# Patient Record
Sex: Male | Born: 1945 | Race: White | Hispanic: No | Marital: Married | Smoking: Former smoker
Health system: Southern US, Community
[De-identification: ages and names within clinical notes are randomized; demographics above are authoritative.]

## PROBLEM LIST (undated history)

## (undated) DIAGNOSIS — T7840XA Allergy, unspecified, initial encounter: Secondary | ICD-10-CM

## (undated) DIAGNOSIS — F0390 Unspecified dementia without behavioral disturbance: Secondary | ICD-10-CM

## (undated) DIAGNOSIS — N390 Urinary tract infection, site not specified: Secondary | ICD-10-CM

## (undated) DIAGNOSIS — R413 Other amnesia: Secondary | ICD-10-CM

## (undated) HISTORY — PX: CATARACT EXTRACTION W/ INTRAOCULAR LENS  IMPLANT, BILATERAL: SHX1307

## (undated) HISTORY — PX: VARICOSE VEIN SURGERY: SHX832

## (undated) HISTORY — DX: Other amnesia: R41.3

## (undated) HISTORY — DX: Urinary tract infection, site not specified: N39.0

## (undated) HISTORY — DX: Allergy, unspecified, initial encounter: T78.40XA

---

## 2013-12-30 DIAGNOSIS — H2510 Age-related nuclear cataract, unspecified eye: Secondary | ICD-10-CM | POA: Diagnosis not present

## 2013-12-30 DIAGNOSIS — H18419 Arcus senilis, unspecified eye: Secondary | ICD-10-CM | POA: Diagnosis not present

## 2015-01-15 DIAGNOSIS — R300 Dysuria: Secondary | ICD-10-CM | POA: Diagnosis not present

## 2015-05-27 ENCOUNTER — Encounter: Payer: Self-pay | Admitting: Family Medicine

## 2015-05-27 ENCOUNTER — Ambulatory Visit (INDEPENDENT_AMBULATORY_CARE_PROVIDER_SITE_OTHER): Payer: Medicare Other | Admitting: Family Medicine

## 2015-05-27 VITALS — BP 138/82 | HR 80 | Temp 97.8°F | Ht 66.0 in | Wt 188.2 lb

## 2015-05-27 DIAGNOSIS — R413 Other amnesia: Secondary | ICD-10-CM | POA: Diagnosis not present

## 2015-05-27 DIAGNOSIS — R0683 Snoring: Secondary | ICD-10-CM

## 2015-05-27 NOTE — Patient Instructions (Addendum)
Check on previous pneumonia, flu, tetanus and shingles shots. Go to the lab on the way out.  We'll contact you with your lab report. Shirlee LimerickMarion will call about your referral. Take care.  Glad to see you.

## 2015-05-27 NOTE — Progress Notes (Signed)
Pre visit review using our clinic review tool, if applicable. No additional management support is needed unless otherwise documented below in the visit note.  New patient.  Memory concerns.  Trouble with short term memory, making conversation, following directions.  Gradually worse.  Has been acting out his dreams more.  Has been more anxious, worried.  Fatigued.  Wife has noted apnea during sleep.  Snoring more.    Hasn't seen a doc in years, ie no record to get.   D/w pt that he was due for mult health maintenance issues, we can work on that as we go along.   PMH and SH reviewed  ROS: See HPI, otherwise noncontributory.  Meds, vitals, and allergies reviewed.   GEN: nad, alert and oriented HEENT: mucous membranes moist NECK: supple w/o LA CV: rrr. PULM: ctab, no inc wob ABD: soft, +bs EXT: no edema SKIN: no acute rash Memory testing except for 1/3 on recall, was able to get 2/3 with prompting.  A&Ox3, normal watch reading and normal math testing.

## 2015-05-28 ENCOUNTER — Encounter: Payer: Self-pay | Admitting: Family Medicine

## 2015-05-28 DIAGNOSIS — F0280 Dementia in other diseases classified elsewhere without behavioral disturbance: Secondary | ICD-10-CM | POA: Insufficient documentation

## 2015-05-28 DIAGNOSIS — G309 Alzheimer's disease, unspecified: Secondary | ICD-10-CM

## 2015-05-28 DIAGNOSIS — R0683 Snoring: Secondary | ICD-10-CM | POA: Insufficient documentation

## 2015-05-28 LAB — CBC WITH DIFFERENTIAL/PLATELET
BASOS ABS: 0 10*3/uL (ref 0.0–0.1)
Basophils Relative: 0.8 % (ref 0.0–3.0)
EOS ABS: 0.3 10*3/uL (ref 0.0–0.7)
Eosinophils Relative: 4.7 % (ref 0.0–5.0)
HEMATOCRIT: 43.2 % (ref 39.0–52.0)
HEMOGLOBIN: 14.4 g/dL (ref 13.0–17.0)
LYMPHS PCT: 19.2 % (ref 12.0–46.0)
Lymphs Abs: 1.2 10*3/uL (ref 0.7–4.0)
MCHC: 33.4 g/dL (ref 30.0–36.0)
MCV: 95.6 fl (ref 78.0–100.0)
Monocytes Absolute: 0.5 10*3/uL (ref 0.1–1.0)
Monocytes Relative: 8.5 % (ref 3.0–12.0)
NEUTROS ABS: 4 10*3/uL (ref 1.4–7.7)
Neutrophils Relative %: 66.8 % (ref 43.0–77.0)
PLATELETS: 230 10*3/uL (ref 150.0–400.0)
RBC: 4.52 Mil/uL (ref 4.22–5.81)
RDW: 13.3 % (ref 11.5–15.5)
WBC: 6 10*3/uL (ref 4.0–10.5)

## 2015-05-28 LAB — COMPREHENSIVE METABOLIC PANEL
ALT: 14 U/L (ref 0–53)
AST: 16 U/L (ref 0–37)
Albumin: 3.8 g/dL (ref 3.5–5.2)
Alkaline Phosphatase: 85 U/L (ref 39–117)
BILIRUBIN TOTAL: 0.5 mg/dL (ref 0.2–1.2)
BUN: 19 mg/dL (ref 6–23)
CALCIUM: 9.3 mg/dL (ref 8.4–10.5)
CO2: 30 meq/L (ref 19–32)
CREATININE: 0.91 mg/dL (ref 0.40–1.50)
Chloride: 104 mEq/L (ref 96–112)
GFR: 87.53 mL/min (ref >60.00)
GLUCOSE: 93 mg/dL (ref 70–99)
Potassium: 4.4 mEq/L (ref 3.5–5.1)
Sodium: 141 mEq/L (ref 135–145)
TOTAL PROTEIN: 6.5 g/dL (ref 6.0–8.3)

## 2015-05-28 LAB — TSH: TSH: 1.55 u[IU]/mL (ref 0.35–4.50)

## 2015-05-28 LAB — VITAMIN B12: VITAMIN B 12: 514 pg/mL (ref 211–911)

## 2015-05-28 NOTE — Assessment & Plan Note (Signed)
Could be due to sleep deprivation from OSA.  Refer to pulmonary for testing.   D/w pt about OSA path/phys.  Check basic labs in the meantime.   We can consider head CT should his sx continue and other testing be unremarkable.   He agrees with plan.

## 2015-05-28 NOTE — Assessment & Plan Note (Signed)
Refer.  See above. >30 minutes spent in face to face time with patient, >50% spent in counselling or coordination of care.

## 2015-05-29 ENCOUNTER — Encounter: Payer: Self-pay | Admitting: *Deleted

## 2015-06-30 ENCOUNTER — Ambulatory Visit (INDEPENDENT_AMBULATORY_CARE_PROVIDER_SITE_OTHER): Payer: Medicare Other | Admitting: Internal Medicine

## 2015-06-30 ENCOUNTER — Encounter: Payer: Self-pay | Admitting: Internal Medicine

## 2015-06-30 VITALS — BP 132/80 | HR 81 | Ht 69.0 in | Wt 191.2 lb

## 2015-06-30 DIAGNOSIS — R0683 Snoring: Secondary | ICD-10-CM | POA: Diagnosis not present

## 2015-06-30 DIAGNOSIS — R413 Other amnesia: Secondary | ICD-10-CM

## 2015-06-30 DIAGNOSIS — G4719 Other hypersomnia: Secondary | ICD-10-CM | POA: Diagnosis not present

## 2015-06-30 NOTE — Progress Notes (Signed)
[facility]      Assessment and Plan:  Excessive daytime sleepiness. -With snoring and witnessed apneas, which are suspicious for obstructive sleep apnea. -We will send for sleep study.  Impaired memory and concentration difficulty. -Discussed possibility that this may be secondary to sleep apnea. -The sleep evaluation is negative, or if the symptoms do not respond to treatment for sleep apnea may need to investigate further into other causes for the patient's possible early dementia.  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 -This is a possible diagnosis giving his behavior of acting out his dreams. However, this may be due to sleep apnea, we'll therefore evaluated and treat for sleep apnea as needed and reevaluate symptoms. -If sleep study is negative or if his symptoms do not respond to treatment, may need to evaluate further and treat for REM behavior disorder. -Patient's wife notes that he has symptoms of dementia and forgetfulness, while this may be due to sleep apnea, REM behavior disorder can be an early sign of Parkinson's dementia which we may need to evaluate further if his symptoms progress. -Patient was evaluated in the clinic today. He showed no signs of cogwheel rigidity nor gait instability.  Date: 06/30/2015  MRN# [id number] Bryan Berry 04/16/1946  Referring Physician: Saintclair HalstedG. Berry.   Bryan Berry is a 70 y.o. old male seen in consultation for chief complaint of:    Chief Complaint  Patient presents with  . SLEEP CONSULT    pt. ref. by Bryan Berry. pt. states he has loud snoring. wife states pt. sometimes is gasping for air during sleep. occ. day time sleepiness. EPWORTH score: 12    HPI:  Patient is a 70 year old male, he is referred due to his wife noticing that he is snoring at night, she also notices witnessed apneas. In addition, the patient is having trouble with short term memory and appears to be acting out his dreams more.  He  typically goes to bed between 10 and 11 PM, he falls asleep within 15 minutes. He does not typically wake up during the night, he wakes up at 7 AM. He's lost approximately 20-25 pounds over the last couple of years.  He is brought in by his wife who provides much of the history. She says that he gasps for air, he can occasionally flail his arms, he has grabbed his wifes hair, and sometimes he will kick his legs. She notes that she can wake him up from from one of thes e episodes and speak with him, but he does not remember it the next morning. He does not remember any of the things that she describes.   She notes that his memory is worsening, he is also anxious than he used to be. Overall these problems have been going on for the past year, and have been progressing.   PMHX:   Past Medical History  Diagnosis Date  . Allergy   . UTI (lower urinary tract infection)     one episode   Surgical Hx:  Past Surgical History  Procedure Laterality Date  . Varicose vein surgery     Family Hx:  Family History  Problem Relation Age of Onset  . Heart disease Father    Social Hx:   Social History  Substance Use Topics  . Smoking status: Former Games developermoker  . Smokeless tobacco: Never Used     Comment: smoked a pipe, quit over 20 years ago  . Alcohol Use: 0.0 oz/week    0 Standard drinks or equivalent  per week     Comment: wine occassionally   Medication:   Current Outpatient Rx  Name  Route  Sig  Dispense  Refill  . Ascorbic Acid (VITAMIN C) 1000 MG tablet   Oral   Take 1,000 mg by mouth daily.         . Omega-3 Krill Oil 1000 MG CAPS   Oral   Take by mouth daily.         Marland Kitchen VITAMIN A PO   Oral   Take by mouth daily.             Allergies:  Review of patient's allergies indicates no known allergies.  Review of Systems: Gen:  Denies  fever, sweats, chills HEENT: Denies blurred vision, double vision. bleeds, sore throat Cvc:  No dizziness, chest pain. Resp:   Denies cough or  sputum porduction, shortness of breath Gi: Denies swallowing difficulty, stomach pain. Gu:  Denies bladder incontinence, burning urine Ext:   No Joint pain, stiffness. Skin: No skin rash,  hives Endoc:  No polyuria, polydipsia. Psych: No depression, insomnia. Other:  All other systems were reviewed with the patient and were negative other that what is mentioned in the HPI.   Physical Examination:   VS: BP 132/80 mmHg  Pulse 81  Ht 5\' 9"  (1.753 m)  Wt 191 lb 3.2 oz (86.728 kg)  BMI 28.22 kg/m2  SpO2 95%  General Appearance: No distress  Neuro:without focal findings,  speech normal,  HEENT: PERRLA, EOM intact.  Mallampati 3 Pulmonary: normal breath sounds, No wheezing.  CardiovascularNormal S1,S2.  No m/r/g.   Abdomen: Benign, Soft, non-tender. Renal:  No costovertebral tenderness  GU:  No performed at this time. Endoc: No evident thyromegaly, no signs of acromegaly. Skin:   warm, no rashes, no ecchymosis  Extremities: normal, no cyanosis, clubbing. No cogwheel rigidity noted. Patient was ambulated 300 feet, he showed no dyspnea, his gait was normal.  Other findings:    LABORATORY PANEL:   CBC No results for input(s): WBC, HGB, HCT, PLT in the last 168 hours. ------------------------------------------------------------------------------------------------------------------  Chemistries  No results for input(s): NA, K, CL, CO2, GLUCOSE, BUN, CREATININE, CALCIUM, MG, AST, ALT, ALKPHOS, BILITOT in the last 168 hours.  Invalid input(s): GFRCGP ------------------------------------------------------------------------------------------------------------------  Cardiac Enzymes No results for input(s): TROPONINI in the last 168 hours. ------------------------------------------------------------  RADIOLOGY:  No results found.     Thank  you for the consultation and for allowing [facility] to assist in the care of your patient. Our recommendations  are noted above.  Please contact us if we can be of further service.   Bryan Guiles, MD.  Board Certified in Internal Medicine, Pulmonary Medicine, Critical Care Medicine, and Sleep Medicine.   Pulmonary and Critical Care   Santiago Glad, M.D.  Stephanie Acre, M.D.  Billy Fischer, M.D

## 2015-06-30 NOTE — Patient Instructions (Signed)
--  Send for sleep study.   Sleep Apnea Sleep apnea is disorder that affects a person's sleep. A person with sleep apnea has abnormal pauses in their breathing when they sleep. It is hard for them to get a good sleep. This makes a person tired during the day. It also can lead to other physical problems. There are three types of sleep apnea. One type is when breathing stops for a short time because your airway is blocked (obstructive sleep apnea). Another type is when the brain sometimes fails to give the normal signal to breathe to the muscles that control your breathing (central sleep apnea). The third type is a combination of the other two types. HOME CARE   Take all medicine as told by your doctor.  Avoid alcohol, calming medicines (sedatives), and depressant drugs.  Try to lose weight if you are overweight. Talk to your doctor about a healthy weight goal.  Your doctor may have you use a device that helps to open your airway. It can help you get the air that you need. It is called a positive airway pressure (PAP) device.   MAKE SURE YOU:   Understand these instructions.  Will watch your condition.  Will get help right away if you are not doing well or get worse.  It may take approximately 1 month for you to get used to wearing her CPAP every night.  Be sure to work with

## 2015-07-14 ENCOUNTER — Ambulatory Visit: Payer: Medicare Other | Attending: Pulmonary Disease

## 2015-07-14 DIAGNOSIS — G4719 Other hypersomnia: Secondary | ICD-10-CM | POA: Diagnosis not present

## 2015-07-14 DIAGNOSIS — R0683 Snoring: Secondary | ICD-10-CM | POA: Insufficient documentation

## 2015-07-14 DIAGNOSIS — G4733 Obstructive sleep apnea (adult) (pediatric): Secondary | ICD-10-CM | POA: Insufficient documentation

## 2015-07-14 DIAGNOSIS — R413 Other amnesia: Secondary | ICD-10-CM | POA: Insufficient documentation

## 2015-07-22 DIAGNOSIS — G4733 Obstructive sleep apnea (adult) (pediatric): Secondary | ICD-10-CM

## 2015-07-23 DIAGNOSIS — Z1211 Encounter for screening for malignant neoplasm of colon: Secondary | ICD-10-CM | POA: Diagnosis not present

## 2015-07-23 DIAGNOSIS — R102 Pelvic and perineal pain: Secondary | ICD-10-CM | POA: Diagnosis not present

## 2015-07-23 DIAGNOSIS — R194 Change in bowel habit: Secondary | ICD-10-CM | POA: Diagnosis not present

## 2015-07-24 ENCOUNTER — Telehealth: Payer: Self-pay | Admitting: *Deleted

## 2015-07-24 DIAGNOSIS — G4733 Obstructive sleep apnea (adult) (pediatric): Secondary | ICD-10-CM

## 2015-07-24 NOTE — Telephone Encounter (Signed)
CPAP titration ordered. Pt aware.

## 2015-07-28 DIAGNOSIS — K5790 Diverticulosis of intestine, part unspecified, without perforation or abscess without bleeding: Secondary | ICD-10-CM | POA: Diagnosis not present

## 2015-07-28 DIAGNOSIS — K635 Polyp of colon: Secondary | ICD-10-CM | POA: Diagnosis not present

## 2015-07-28 DIAGNOSIS — Z1211 Encounter for screening for malignant neoplasm of colon: Secondary | ICD-10-CM | POA: Diagnosis not present

## 2015-07-28 DIAGNOSIS — R194 Change in bowel habit: Secondary | ICD-10-CM | POA: Diagnosis not present

## 2015-07-28 LAB — HM COLONOSCOPY

## 2015-07-30 ENCOUNTER — Other Ambulatory Visit: Payer: Self-pay | Admitting: Family Medicine

## 2015-08-04 ENCOUNTER — Encounter: Payer: Self-pay | Admitting: Family Medicine

## 2015-08-20 ENCOUNTER — Ambulatory Visit: Payer: Medicare Other | Attending: Pulmonary Disease

## 2015-08-20 DIAGNOSIS — G4733 Obstructive sleep apnea (adult) (pediatric): Secondary | ICD-10-CM | POA: Insufficient documentation

## 2015-08-28 DIAGNOSIS — G4733 Obstructive sleep apnea (adult) (pediatric): Secondary | ICD-10-CM | POA: Diagnosis not present

## 2015-09-02 ENCOUNTER — Telehealth: Payer: Self-pay | Admitting: *Deleted

## 2015-09-02 DIAGNOSIS — G4733 Obstructive sleep apnea (adult) (pediatric): Secondary | ICD-10-CM

## 2015-09-02 NOTE — Telephone Encounter (Signed)
Spoke with pt's wife to inform we are placing the order for the BiPAP. She ask if we will send the order to:  [facility] [address] [place], [place]) [phone](f) [phone]   Order placed.

## 2015-09-04 DIAGNOSIS — R102 Pelvic and perineal pain: Secondary | ICD-10-CM | POA: Diagnosis not present

## 2015-09-04 DIAGNOSIS — K5790 Diverticulosis of intestine, part unspecified, without perforation or abscess without bleeding: Secondary | ICD-10-CM | POA: Diagnosis not present

## 2015-09-04 DIAGNOSIS — Z86010 Personal history of colonic polyps: Secondary | ICD-10-CM | POA: Diagnosis not present

## 2015-09-04 DIAGNOSIS — R194 Change in bowel habit: Secondary | ICD-10-CM | POA: Diagnosis not present

## 2015-09-10 ENCOUNTER — Telehealth: Payer: Self-pay | Admitting: Internal Medicine

## 2015-09-10 NOTE — Telephone Encounter (Signed)
[facility].    Gave number to [employer] to check status.

## 2015-10-13 ENCOUNTER — Ambulatory Visit: Payer: Medicare Other | Admitting: Internal Medicine

## 2015-10-23 ENCOUNTER — Emergency Department: Payer: Medicare Other

## 2015-10-23 ENCOUNTER — Emergency Department
Admission: EM | Admit: 2015-10-23 | Discharge: 2015-10-24 | Disposition: A | Discharge status: Home / Self Care | Payer: Medicare Other | Attending: Emergency Medicine | Admitting: Emergency Medicine

## 2015-10-23 ENCOUNTER — Encounter: Payer: Self-pay | Admitting: Emergency Medicine

## 2015-10-23 DIAGNOSIS — Z87891 Personal history of nicotine dependence: Secondary | ICD-10-CM | POA: Insufficient documentation

## 2015-10-23 DIAGNOSIS — R4182 Altered mental status, unspecified: Secondary | ICD-10-CM | POA: Diagnosis not present

## 2015-10-23 DIAGNOSIS — F0391 Unspecified dementia with behavioral disturbance: Secondary | ICD-10-CM

## 2015-10-23 DIAGNOSIS — R413 Other amnesia: Secondary | ICD-10-CM | POA: Diagnosis not present

## 2015-10-23 DIAGNOSIS — R410 Disorientation, unspecified: Secondary | ICD-10-CM | POA: Diagnosis not present

## 2015-10-23 LAB — COMPREHENSIVE METABOLIC PANEL WITH GFR
ALT: 16 U/L — ABNORMAL LOW (ref 17–63)
AST: 22 U/L (ref 15–41)
Albumin: 4 g/dL (ref 3.5–5.0)
Alkaline Phosphatase: 66 U/L (ref 38–126)
Anion gap: 6 (ref 5–15)
BUN: 22 mg/dL — ABNORMAL HIGH (ref 6–20)
CO2: 27 mmol/L (ref 22–32)
Calcium: 9.1 mg/dL (ref 8.9–10.3)
Chloride: 107 mmol/L (ref 101–111)
Creatinine, Ser: 0.98 mg/dL (ref 0.61–1.24)
GFR calc Af Amer: >60 mL/min
GFR calc non Af Amer: >60 mL/min
Glucose, Bld: 101 mg/dL — ABNORMAL HIGH (ref 65–99)
Potassium: 4 mmol/L (ref 3.5–5.1)
Sodium: 140 mmol/L (ref 135–145)
Total Bilirubin: 0.8 mg/dL (ref 0.3–1.2)
Total Protein: 6.6 g/dL (ref 6.5–8.1)

## 2015-10-23 LAB — CBC
HEMATOCRIT: 41.7 % (ref 40.0–52.0)
HEMOGLOBIN: 14.3 g/dL (ref 13.0–18.0)
MCH: 32.4 pg (ref 26.0–34.0)
MCHC: 34.4 g/dL (ref 32.0–36.0)
MCV: 94.4 fL (ref 80.0–100.0)
Platelets: 182 10*3/uL (ref 150–440)
RBC: 4.42 MIL/uL (ref 4.40–5.90)
RDW: 13.7 % (ref 11.5–14.5)
WBC: 7.7 10*3/uL (ref 3.8–10.6)

## 2015-10-23 NOTE — ED Notes (Signed)
Wife reports that the patient has had short term memory loss for about a year. Wife reports that the patient became extremely confused around 16:00 today. Patient was hallucinating that there were other people in the house. Patient also stating to his wife that he has three wives.

## 2015-10-24 ENCOUNTER — Emergency Department: Payer: Medicare Other

## 2015-10-24 DIAGNOSIS — R410 Disorientation, unspecified: Secondary | ICD-10-CM | POA: Diagnosis not present

## 2015-10-24 DIAGNOSIS — R413 Other amnesia: Secondary | ICD-10-CM | POA: Diagnosis not present

## 2015-10-24 DIAGNOSIS — R4182 Altered mental status, unspecified: Secondary | ICD-10-CM | POA: Diagnosis not present

## 2015-10-24 LAB — ETHANOL

## 2015-10-24 LAB — URINALYSIS COMPLETE WITH MICROSCOPIC (ARMC ONLY)
Bilirubin Urine: NEGATIVE
GLUCOSE, UA: NEGATIVE mg/dL
Hgb urine dipstick: NEGATIVE
KETONES UR: NEGATIVE mg/dL
Leukocytes, UA: NEGATIVE
Nitrite: NEGATIVE
PROTEIN: NEGATIVE mg/dL
Specific Gravity, Urine: 1.016 (ref 1.005–1.030)
Squamous Epithelial / LPF: NONE SEEN
pH: 6 (ref 5.0–8.0)

## 2015-10-24 LAB — ACETAMINOPHEN LEVEL: Acetaminophen (Tylenol), Serum: <10 ug/mL — ABNORMAL LOW (ref 10–30)

## 2015-10-24 LAB — TSH: TSH: 2.838 u[IU]/mL (ref 0.350–4.500)

## 2015-10-24 LAB — VITAMIN B12: Vitamin B-12: 484 pg/mL (ref 180–914)

## 2015-10-24 LAB — SALICYLATE LEVEL

## 2015-10-24 NOTE — Discharge Instructions (Signed)
You have been offered a stay in the emergency department to see a psychiatrist, but you would prefer to go home. This is certainly not unreasonable and is your choice however at any time if there is any decompensation or you feel that Bryan Berry is in danger of hurting himself or others or your concern in general about how he is acting, return to the emergency department right away. Follow closely as already scheduled with primary care doctor early next week and they should be able to refer you to a geriatric psychiatric physician.. Reviewed, we're always at your disposition and we are happy to help if you need us. Please return to the emergency room for any concerns. Dementia Dementia is a general term for problems with brain function. A person with dementia has memory loss and a hard time with at least one other brain function such as thinking, speaking, or problem solving. Dementia can affect social functioning, how you do your job, your mood, or your personality. The changes may be hidden for a long time. The earliest forms of this disease are usually not detected by family or friends. Dementia can be:  Irreversible.  Potentially reversible.  Partially reversible.  Progressive. This means it can get worse over time. CAUSES  Irreversible dementia causes may include:  Degeneration of brain cells (Alzheimer disease or Lewy body dementia).  Multiple small strokes (vascular dementia).  Infection (chronic meningitis or Creutzfeldt-Jakob disease).  Frontotemporal dementia. This affects younger people, age 71 to 3570, compared to those who have Alzheimer disease.  Dementia associated with other disorders like Parkinson disease, Huntington disease, or HIV-associated dementia. Potentially or partially reversible dementia causes may include:  Medicines.  Metabolic causes such as excessive alcohol intake, vitamin B12 deficiency, or thyroid disease.  Masses or pressure in the brain such as a  tumor, blood clot, or hydrocephalus. SIGNS AND SYMPTOMS  Symptoms are often hard to detect. Family members or coworkers may not notice them early in the disease process. Different people with dementia may have different symptoms. Symptoms can include:  A hard time with memory, especially recent memory. Long-term memory may not be impaired.  Asking the same question multiple times or forgetting something someone just said.  A hard time speaking your thoughts or finding certain words.  A hard time solving problems or performing familiar tasks (such as how to use a telephone).  Sudden changes in mood.  Changes in personality, especially increasing moodiness or mistrust.  Depression.  A hard time understanding complex ideas that were never a problem in the past. DIAGNOSIS  There are no specific tests for dementia.   Your health care provider may recommend a thorough evaluation. This is because some forms of dementia can be reversible. The evaluation will likely include a physical exam and getting a detailed history from you and a family member. The history often gives the best clues and suggestions for a diagnosis.  Memory testing may be done. A detailed brain function evaluation called neuropsychologic testing may be helpful.  Lab tests and brain imaging (such as a CT scan or MRI scan) are sometimes important.  Sometimes observation and re-evaluation over time is very helpful. TREATMENT  Treatment depends on the cause.   If the problem is a vitamin deficiency, it may be helped or cured with supplements.  For dementias such as Alzheimer disease, medicines are available to stabilize or slow the course of the disease. There are no cures for this type of dementia.  Your health care  provider can help direct you to groups, organizations, and other health care providers to help with decisions in the care of you or your loved one. HOME CARE INSTRUCTIONS The care of individuals with  dementia is varied and dependent upon the progression of the dementia. The following suggestions are intended for the person living with, or caring for, the person with dementia.  Create a safe environment.  Remove the locks on bathroom doors to prevent the person from accidentally locking himself or herself in.  Use childproof latches on kitchen cabinets and any place where cleaning supplies, chemicals, or alcohol are kept.  Use childproof covers in unused electrical outlets.  Install childproof devices to keep doors and windows secured.  Remove stove knobs or install safety knobs and an automatic shut-off on the stove.  Lower the temperature on water heaters.  Label medicines and keep them locked up.  Secure knives, lighters, matches, power tools, and guns, and keep these items out of reach.  Keep the house free from clutter. Remove rugs or anything that might contribute to a fall.  Remove objects that might break and hurt the person.  Make sure lighting is good, both inside and outside.  Install grab rails as needed.  Use a monitoring device to alert you to falls or other needs for help.  Reduce confusion.  Keep familiar objects and people around.  Use night lights or dim lights at night.  Label items or areas.  Use reminders, notes, or directions for daily activities or tasks.  Keep a simple, consistent routine for waking, meals, bathing, dressing, and bedtime.  Create a calm, quiet environment.  Place large clocks and calendars prominently.  Display emergency numbers and home address near all telephones.  Use cues to establish different times of the day. An example is to open curtains to let the natural light in during the day.   Use effective communication.  Choose simple words and short sentences.  Use a gentle, calm tone of voice.  Be careful not to interrupt.  If the person is struggling to find a word or communicate a thought, try to provide the  word or thought.  Ask one question at a time. Allow the person ample time to answer questions. Repeat the question again if the person does not respond.  Reduce nighttime restlessness.  Provide a comfortable bed.  Have a consistent nighttime routine.  Ensure a regular walking or physical activity schedule. Involve the person in daily activities as much as possible.  Limit napping during the day.  Limit caffeine.  Attend social events that stimulate rather than overwhelm the senses.  Encourage good nutrition and hydration.  Reduce distractions during meal times and snacks.  Avoid foods that are too hot or too cold.  Monitor chewing and swallowing ability.  Continue with routine vision, hearing, dental, and medical screenings.  Give medicines only as directed by the health care provider.  Monitor driving abilities. Do not allow the person to drive when safe driving is no longer possible.  Register with an identification program which could provide location assistance in the event of a missing person situation. SEEK MEDICAL CARE IF:   New behavioral problems start such as moodiness, aggressiveness, or seeing things that are not there (hallucinations).  Any new problem with brain function happens. This includes problems with balance, speech, or falling a lot.  Problems with swallowing develop.  Any symptoms of other illness happen. Small changes or worsening in any aspect of brain function can  be a sign that the illness is getting worse. It can also be a sign of another medical illness such as infection. Seeing a health care provider right away is important. SEEK IMMEDIATE MEDICAL CARE IF:   A fever develops.  New or worsened confusion develops.  New or worsened sleepiness develops.  Staying awake becomes hard to do.   This information is not intended to replace advice given to you by your health care provider. Make sure you discuss any questions you have with your  health care provider.   Document Released: 12/07/2000 Document Revised: 07/04/2014 Document Reviewed: 11/08/2010 Elsevier Interactive Patient Education [employer]

## 2015-10-24 NOTE — ED Notes (Signed)
Patient transported to X-ray 

## 2015-10-24 NOTE — ED Provider Notes (Addendum)
[facility] Emergency Department Provider Note  ____________________________________________   I have reviewed the triage vital signs and the nursing notes.   HISTORY  Chief Complaint Altered Mental Status and Hallucinations    HPI Bryan Berry is a 70 y.o. male who presents today with confusion. Family states he does have a history of short-term memory loss and has had this for 6/8 months at least. Theydid start the patient on BiPAP for sleep study and was told that this would help. Since only complaint himself is that he has short-term memory loss. He denies any recollection of any conversations of any concern with his family. His family, who are at bedside do state the patient has had a progressive short-term memory loss for the last several months. However, this is been interspersed with other episodes of paranoia and bizarre behavior. Specifically, today this afternoon he became convinced that someone also just taken a shower in the room, he is worried that his wife was out to get him, that he needed to get a lawyer, and that he had 3 different lives all the same name living and different houses. This degree of confusion is somewhat atypical for the patient, he has been confused before, his son states that he has had off and on over the last several weeks some paranoid thoughts centering around the [place], he has not exhibited any violent behavior towards anyone nor as he exhibited any tendencies towards self-harm and he denies SI or HI. Patient does state sometimes he feels as though things could be out to get him in this world and he specifies airplane crashes and things like that, "if they happen around here". Otherwise, his only concern is his memory he has no recollection of these confused thoughts. The family state that even on the way here he was still "not right".      Past Medical History  Diagnosis Date  . Allergy   . UTI (lower urinary tract infection)      one episode    Patient Active Problem List   Diagnosis Date Noted  . Memory change 05/28/2015  . Snoring 05/28/2015    Past Surgical History  Procedure Laterality Date  . Varicose vein surgery      No current outpatient prescriptions on file.  Allergies Review of patient's allergies indicates no known allergies.  Family History  Problem Relation Age of Onset  . Heart disease Father     Social History Social History  Substance Use Topics  . Smoking status: Former Games developer  . Smokeless tobacco: Never Used     Comment: smoked a pipe, quit over 20 years ago  . Alcohol Use: 0.0 oz/week    0 Standard drinks or equivalent per week     Comment: wine occassionally    Review of Systems Constitutional: No fever/chills Eyes: No visual changes. ENT: No sore throat. No stiff neck no neck pain Cardiovascular: Denies chest pain. Respiratory: Denies shortness of breath. Gastrointestinal:   no vomiting.  No diarrhea.  No constipation. Genitourinary: Negative for dysuria. Musculoskeletal: Negative lower extremity swelling Skin: Negative for rash. Neurological: Negative for headaches, focal weakness or numbness. 10-point ROS otherwise negative.  ____________________________________________   PHYSICAL EXAM:  VITAL SIGNS: ED Triage Vitals  Enc Vitals Group     BP 10/23/15 2325 153/84 mmHg     Pulse Rate 10/23/15 2325 85     Resp 10/23/15 2325 18     Temp 10/23/15 2325 98.6 F (37 C)  Temp src --      SpO2 10/23/15 2325 96 %     Weight 10/23/15 2323 190 lb (86.183 kg)     Height 10/23/15 2323  (1.727 m)     Head Cir --      Peak Flow --      Pain Score 10/23/15 2346 0     Pain Loc --      Pain Edu? --      Excl. in GC? --     Constitutional: Alert and oriented. Well appearing and in no acute distress. Eyes: Conjunctivae are normal. PERRL. EOMI. Head: Atraumatic. Nose: No congestion/rhinnorhea. Mouth/Throat: Mucous membranes are moist.  Oropharynx  non-erythematous. Neck: No stridor.   Nontender with no meningismus Cardiovascular: Normal rate, regular rhythm. Grossly normal heart sounds.  Good peripheral circulation. Respiratory: Normal respiratory effort.  No retractions. Lungs CTAB. Abdominal: Soft and nontender. No distention. No guarding no rebound Back:  There is no focal tenderness or step off there is no midline tenderness there are no lesions noted. there is no CVA tenderness Musculoskeletal: No lower extremity tenderness. No joint effusions, no DVT signs strong distal pulses no edema Neurologic:  Cranial nerves II through XII are grossly intact 5 out of 5 strength bilateral upper and lower extremity. Finger to nose within normal limits heel to shin within normal limits, speech is normal with no word finding difficulty or dysarthria, reflexes symmetric, pupils are equally round and reactive to light, there is no pronator drift, sensation is normal, vision is intact to confrontation, gait is deferred, there is no nystagmus, normal neurologic exam  Skin:  Skin is warm, dry and intact. No rash noted. Psychiatric: Mood and affect are normal. Speech and behavior are normal.  ____________________________________________   LABS (all labs ordered are listed, but only abnormal results are displayed)  Labs Reviewed  COMPREHENSIVE METABOLIC PANEL - Abnormal; Notable for the following:    Glucose, Bld 101 (*)    BUN 22 (*)    ALT 16 (*)    All other components within normal limits  CBC  URINALYSIS COMPLETEWITH MICROSCOPIC ([facility] ONLY)  SALICYLATE LEVEL  ACETAMINOPHEN LEVEL  ETHANOL  CBG MONITORING, ED   ____________________________________________  EKG  I personally interpreted any EKGs ordered by me or triage Sinus rhythm rate 84 bpm no acute ST elevation or acute ST depression normal axis unremarkable EKG ____________________________________________  RADIOLOGY  I reviewed any imaging ordered by me or triage that were  performed during my shift and, if possible, patient and/or family made aware of any abnormal findings. ____________________________________________   PROCEDURES  Procedure(s) performed: None  Critical Care performed: None  ____________________________________________   INITIAL IMPRESSION / ASSESSMENT AND PLAN / ED COURSE  Pertinent labs & imaging results that were available during my care of the patient were reviewed by me and considered in my medical decision making (see chart for details).  Patient presents today with what appears to be an acute on chronic gradually worsening state of recurrent confusion which is episodic and worsening over time. This is been going on for weeks to months. However today became as the family agreed a quantitative change that resulted in a qualitative change. The patient at this time has no thoughts of hurting himself or others no paranoid delusions, he is a very pleasant gentleman in no acute distress who is concerned about his memory loss. He has no recollection of all the bizarre things that he was saying to his family earlier. We will  institute a biologic workup although again I have very low suspicion that this is anything but progressive dementia.  ----------------------------------------- 1:28 AM on 10/24/2015 -----------------------------------------  Patient remains a symptomatically, family states he is still not completely back to his baseline although again that has been fluctuating over the last several months. He has no SI or HI and he is not obviously hallucinating he is oriented and in no acute distress. I discussed with the family at this time I don't think he absolutely has to stay in the hospital but if they would prefer to stay and have him see a psychiatrist in the morning may be able to help them with these outbursts . Marland Kitchen. At issue is whether or not the patient is acutely a danger to himself or others at this point in time he is not  although there is no way that I can predict or the family really compare-how he will act in the future. The family and the patient have all discussed their options. They feel very comfortable taking him home. They do not feel he is any danger of hurting himself or others. Again this is a gradually worsening dementia over what they now report his 1 year. I have made it abundantly clear to them that I'm happy to keep him in the hospital and can IVC the patient apparently think he is a danger to him or them but they are adamant that he is not. I have also offered to keep the patient in a non-IVC status appeared prefer until the morning to see if he can see his psychiatrist then, they would also prefer to go home with them. The son and wife will stay with him in his house, and they would prefer to be discharged. Patient has no thoughts of harming himself or others is not acutely psychotic is actually awake alert oriented and in no acute distress. Given that there is no evidence of delirium given the time course of her months of this gradual progression and that there is no indication at this time of acute biological process or pathology causing this, I think that the patient will be safe for discharge at the family's insistence on request. However they are aware of that at a time if they're worried about him they must come back to the emergency department. The patient does not have any access to weapons or guns. ____________________________________________   FINAL CLINICAL IMPRESSION(S) / ED DIAGNOSES  Final diagnoses:  None      This chart was dictated using voice recognition software.  Despite best efforts to proofread,  errors can occur which can change meaning.     Jeanmarie PlantJames A Jaja Switalski, MD 10/24/15 0010  Jeanmarie PlantJames A Babetta Paterson, MD 10/24/15 [phone]  Jeanmarie PlantJames A Amrom Ore, MD 10/24/15 [id number]  Jeanmarie PlantJames A Albina Gosney, MD 10/24/15 [phone]

## 2015-10-26 ENCOUNTER — Encounter: Payer: Self-pay | Admitting: Internal Medicine

## 2015-10-26 ENCOUNTER — Telehealth: Payer: Self-pay | Admitting: *Deleted

## 2015-10-26 ENCOUNTER — Ambulatory Visit (INDEPENDENT_AMBULATORY_CARE_PROVIDER_SITE_OTHER): Payer: Medicare Other | Admitting: Internal Medicine

## 2015-10-26 VITALS — BP 132/80 | HR 87 | Ht 68.0 in | Wt 187.0 lb

## 2015-10-26 DIAGNOSIS — G4733 Obstructive sleep apnea (adult) (pediatric): Secondary | ICD-10-CM

## 2015-10-26 NOTE — Patient Instructions (Addendum)
HIt the ramp button on your machine if you wake up and have trouble tolerating the pressure. If this plan is not working out call us and we will adjust the plan as needed.    Try to wear your mask the whole night.

## 2015-10-26 NOTE — Progress Notes (Signed)
[facility]     Assessment and Plan:  Obstructive sleep apnea. -Severe OSA, with AHI of 34. -Continue on BiPAP at 15/10 -I encouraged him to use the ramp button on his machine if he wakes up and he has trouble tolerating the pressure. He should immediately put his mask back on and continue to try to keep the mask on for the duration of the evening.  Excessive daytime sleepiness. -Improved with treatment of sleep apnea.   Date: 10/26/2015  MRN# [id number] Toryn Mcclinton [birth year]   Aarish Rockers is a 70 y.o. old male seen in follow up for chief complaint of  Chief Complaint  Patient presents with  . Follow-up    pt states he wears CPAP at least 4hr nightly. feels pressure is good. no supplies needed. [id number]     HPI:   The patient is a 70 year old male presented with complaints of excessive daytime sleepiness, witnessed apneas, and occasionally acting out  his dreams. His sleep study was positive for severe obstructive sleep apnea, patient was titrated on BiPAP to a level of 15/10.  He has been using the CPAP every day, he notes that he is doing well with it initially, but after about 2 hours he wakes up and notices that he can no longer tolerate it because it is too much pressure at that time. At that times he often does not put it back on.  Overall he feels less sleepy than before, he is no longer snoring, wearing the CPAP.   Sleep study performed on 07/14/2015, tracings, reviewed, consistent with severe shortness sleep apnea with an apnea hypopnea index of 34. Subsequent titration study on 08/20/2015 was reviewed; sleep latency, equal 0.7 minutes, sleep efficiency equals 95%, AHI equals 8.1, consistent with severe sleepiness. Patient was titrated on BiPAP to a level of 15/10.  Medication:   Outpatient Encounter Prescriptions as of 10/26/2015  Medication Sig  . Misc Natural Products (SAW PALMETTO) CAPS Take 1 capsule by mouth daily.  . Multiple Vitamin  (MULTIVITAMIN WITH MINERALS) TABS tablet Take 1 tablet by mouth daily.  Marland Kitchen OVER THE COUNTER MEDICATION Take 2 tablets by mouth daily. Pt takes Antiiva.  . zinc gluconate 50 MG tablet Take 50 mg by mouth daily.   No facility-administered encounter medications on file as of 10/26/2015.     Allergies:  Review of patient's allergies indicates no known allergies.  Review of Systems: Gen:  Denies  fever, sweats. HEENT: Denies blurred vision. Cvc:  No dizziness, chest pain or heaviness Resp:   Denies cough or sputum porduction. Gi: Denies swallowing difficulty, stomach pain.  Gu:  Denies bladder incontinence, burning urine Ext:   No Joint pain, stiffness. Skin: No skin rash, easy bruising. Endoc:  No polyuria, polydipsia. Psych: No depression, insomnia. Other:  All other systems were reviewed and found to be negative other than what is mentioned in the HPI.   Physical Examination:   VS: BP 132/80 mmHg  Pulse 87  Ht  (1.727 m)  Wt 187 lb (84.823 kg)  BMI 28.44 kg/m2  SpO2 96%  General Appearance: No distress  Neuro:without focal findings,  speech normal,  HEENT: PERRLA, EOM intact. Pulmonary: normal breath sounds, No wheezing.   CardiovascularNormal S1,S2.  No m/r/g.   Abdomen: Benign, Soft, non-tender. Renal:  No costovertebral tenderness  GU:  Not performed at this time. Endoc: No evident thyromegaly, no signs of acromegaly. Skin:   warm, no rash. Extremities: normal, no cyanosis, clubbing.  LABORATORY PANEL:   CBC  Recent Labs Lab 10/23/15 2330  WBC 7.7  HGB 14.3  HCT 41.7  PLT 182   ------------------------------------------------------------------------------------------------------------------  Chemistries   Recent Labs Lab 10/23/15 2330  NA 140  K 4.0  CL 107  CO2 27  GLUCOSE 101*  BUN 22*  CREATININE 0.98  CALCIUM 9.1  AST 22  ALT 16*  ALKPHOS 66  BILITOT 0.8    ------------------------------------------------------------------------------------------------------------------  Cardiac Enzymes No results for input(s): TROPONINI in the last 168 hours. ------------------------------------------------------------  RADIOLOGY:   No results found for this or any previous visit. Results for orders placed during the hospital encounter of 10/23/15  DG Chest 2 View   Narrative CLINICAL DATA:  Acute onset of short-term memory loss. Initial encounter.  EXAM: CHEST  2 VIEW  COMPARISON:  None.  FINDINGS: The lungs are well-aerated and clear. There is no evidence of focal opacification, pleural effusion or pneumothorax.  The heart is normal in size; the mediastinal contour is within normal limits. No acute osseous abnormalities are seen.  IMPRESSION: No acute cardiopulmonary process seen.   Electronically Signed   By: Roanna RaiderJeffery  Chang M.D.   On: 10/24/2015 00:50    ------------------------------------------------------------------------------------------------------------------  Thank  you for allowing [facility] to assist in the care of your patient. Our recommendations are noted above.  Please contact us if we can be of further service.   Wells Guileseep Mallory Enriques, MD.  [place] Office Number: [phone]  Santiago Gladavid Kasa, M.D.  Stephanie AcreVishal Mungal, M.D.  Billy Fischeravid Simonds, M.D  10/26/2015

## 2015-10-26 NOTE — Telephone Encounter (Signed)
[facility].  Called to follow up per Dr. Lianne Bushyuncan's request.  Left message for patient to return call.

## 2015-10-27 ENCOUNTER — Encounter: Payer: Self-pay | Admitting: Family Medicine

## 2015-10-27 ENCOUNTER — Ambulatory Visit (INDEPENDENT_AMBULATORY_CARE_PROVIDER_SITE_OTHER): Payer: Medicare Other | Admitting: Family Medicine

## 2015-10-27 VITALS — BP 120/70 | HR 77 | Temp 97.8°F | Wt 187.5 lb

## 2015-10-27 DIAGNOSIS — F0390 Unspecified dementia without behavioral disturbance: Secondary | ICD-10-CM | POA: Diagnosis not present

## 2015-10-27 MED ORDER — DONEPEZIL HCL 10 MG PO TABS: ORAL_TABLET | ORAL | Status: DC

## 2015-10-27 NOTE — Patient Instructions (Signed)
Start with 1/2 tab aricept for 4 weeks, then change to 1 tab a day.  Update me as needed.  I want to recheck you in about 4-6 weeks, sooner if needed.   Take care.  Glad to see you.

## 2015-10-27 NOTE — Telephone Encounter (Signed)
Patient is currently in the clinic for follow up.

## 2015-10-27 NOTE — Progress Notes (Signed)
Pre visit review using our clinic review tool, if applicable. No additional management support is needed unless otherwise documented below in the visit note.  Memory changes.  Prev with OSA eval and tx as that was possibly contributing to memory changes.  He had recent ER eval, a few nights ago.  Prev with some occ short term memory loss but then recently he couldn't recognize his wife/family.  Friday he was hallucinating, angry, and agitated- he was worried that other people (who weren't present) were at the house.  He wanted to know where his wife was (she was present).  Since the confusion was continued, they went to ER.  Wife is safe at home. Patient is safe at home per wife's report.    He has stopped acting out his dreams, still using CPAP.    Patient and wife deferred neuro eval for now .  PMH and SH reviewed  ROS: Per HPI unless specifically indicated in ROS section   Meds, vitals, and allergies reviewed.   Knows the year but not the month.  Does know the season.   3/3 attention.   Can do math and read a watch.   Knows his address.   2/3 recall.  Understands metaphors, can interpret "those in glass houses should not throw stones."   Can follow commands, can write a sentence.   Can copy pentagons.    GEN: nad, alert and oriented HEENT: mucous membranes moist NECK: supple w/o LA CV: rrr. PULM: ctab, no inc wob ABD: soft, +bs EXT: no edema SKIN: no acute rash CN 2-12 wnl B, S/S/DTR wnl x4

## 2015-10-29 NOTE — Assessment & Plan Note (Addendum)
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  D/w pt and wife.  Start aricept. Start with 1/2 tab aricept for 4 weeks, then change to 1 tab a day.  Update me as needed. I want to recheck pt in about 4-6 weeks, sooner if needed.  Will send AD information/contact re: support groups.   D/w them about rationale for prev OSA eval, but dx is likely AD now, in addition.   >40 minutes spent in face to face time with patient, >50% spent in counselling or coordination of care Discussed safety at home and in general.  Wife and patient okay at home for now.   ER course d/w pt both of them, with prev lab and imaging done.  They declined neuro referral.

## 2015-11-20 ENCOUNTER — Telehealth: Payer: Self-pay | Admitting: Family Medicine

## 2015-11-20 NOTE — Telephone Encounter (Signed)
Please call pt/wife and check on them, let me know how they are doing.  He as dx dementia, recent dx. Thanks.

## 2015-11-20 NOTE — Telephone Encounter (Signed)
Noted. Thanks.

## 2015-11-20 NOTE — Telephone Encounter (Signed)
Spoke to his wife. She said he seems to be doing well even after going through the passing of his Mother on Monday. She appreciated that you wanted to follow-up on them. They will keep you informed.

## 2015-12-07 ENCOUNTER — Encounter: Payer: Self-pay | Admitting: Family Medicine

## 2015-12-07 ENCOUNTER — Ambulatory Visit (INDEPENDENT_AMBULATORY_CARE_PROVIDER_SITE_OTHER): Payer: Medicare Other | Admitting: Family Medicine

## 2015-12-07 VITALS — BP 130/78 | HR 68 | Temp 98.4°F | Wt 186.5 lb

## 2015-12-07 DIAGNOSIS — F0390 Unspecified dementia without behavioral disturbance: Secondary | ICD-10-CM

## 2015-12-07 DIAGNOSIS — R0683 Snoring: Secondary | ICD-10-CM

## 2015-12-07 MED ORDER — DONEPEZIL HCL 10 MG PO TABS: 10.0000 mg | ORAL_TABLET | Freq: Every day | ORAL | Status: DC

## 2015-12-07 NOTE — Patient Instructions (Signed)
Update me in about 1 month, sooner as needed.  Don't change your meds for now.   I'll check on the mask options.  Take care.  Glad to see you.

## 2015-12-07 NOTE — Progress Notes (Signed)
Pre visit review using our clinic review tool, if applicable. No additional management support is needed unless otherwise documented below in the visit note.  Memory loss. On aricept.  Up to 10mg  now.  Started with PM dosing initially with change in dreams at night, but did better with midday dosing.  I had sent information and they are trying to get up with the local AD support group.  He has noted some changes in the meantime.  About 3 weeks into the medicine he stopped asking his wife who she was.  He is less agitated and less paranoid and less confused.  Still with memory troubles noted at baseline.   Still on CPAP.  "I hate using it".  He has some nasal irritation.    Meds, vitals, and allergies reviewed.   ROS: Per HPI unless specifically indicated in ROS section   GEN: nad, alert HEENT: mucous membranes moist, irritation on the bridge of the nose noted.  NECK: supple w/o LA CV: rrr PULM: ctab, no inc wob ABD: soft, +bs EXT: no edema  Knows the year and the month but not the day of the week. Does know the season.  3/3 attention.  Can do math and read a watch.  Knows his address.  1/3 recall.  Understands metaphors, can interpret "those in glass houses should not throw stones."

## 2015-12-07 NOTE — Assessment & Plan Note (Signed)
I'll check with pulmonary about mask fit given the nasal irritation.

## 2015-12-07 NOTE — Assessment & Plan Note (Signed)
>  25 minutes spent in face to face time with patient, >50% spent in counselling or coordination of care Wife checking on support groups.   No change in meds.  Continue as is.   All in agreement.

## 2016-04-28 ENCOUNTER — Encounter: Payer: Self-pay | Admitting: Family Medicine

## 2016-04-28 ENCOUNTER — Ambulatory Visit (INDEPENDENT_AMBULATORY_CARE_PROVIDER_SITE_OTHER): Payer: Medicare Other | Admitting: Family Medicine

## 2016-04-28 VITALS — BP 126/84 | HR 65 | Temp 97.4°F | Wt 189.2 lb

## 2016-04-28 DIAGNOSIS — Z23 Encounter for immunization: Secondary | ICD-10-CM

## 2016-04-28 DIAGNOSIS — F0280 Dementia in other diseases classified elsewhere without behavioral disturbance: Secondary | ICD-10-CM

## 2016-04-28 DIAGNOSIS — G309 Alzheimer's disease, unspecified: Secondary | ICD-10-CM

## 2016-04-28 NOTE — Assessment & Plan Note (Signed)
Discussed with patient and wife about options. 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 His wife had been reading up on a book "the end of Alzheimer's".  She was asking about extra testing and nutritional supplements. He had routine testing done previously for reversible causes. All of this was reviewed at the office visit. She was asking about extra testing like high sensitivity CRP and testosterone levels. We talked about the fact that any result for a high-sensitivity CRP would likely not change his management at this point. I do not think the testosterone repletion would be worth the risk and therefore it would not make sense to test. It is reasonable to get neurology input, for any consideration of extra testing, including neuropsychological testing. I would continue Aricept as is for now. Refer to neurology. All questions answered to the best my ability. Flu shot done at office visit today. I appreciate help of neurology. We can decide about follow-up here after he is seen neurology. >25 minutes spent in face to face time with patient, >50% spent in counselling or coordination of care

## 2016-04-28 NOTE — Progress Notes (Signed)
AD f/u.  Wife has information re: local groups, they have a meeting once a month.  She is considering going at some point, but hasn't gone yet.  His orientation is better on the medicine but not back to prev baseline.  No more episodes where he didn't recognize his wife, since starting aricept. By the end of the day, he loses details from the start of the day.  He was prev acting out dream but not now since on medicine.  He does talk some during dreams.  Wife wakes him and then he drifts back to sleep.  All of this is difficult on his wife.  D/w her at the OV.    He can't tolerate CPAP.  He feels better w/o it.    Meds, vitals, and allergies reviewed.   ROS: Per HPI unless specifically indicated in ROS section   GEN: nad, alert HEENT: mucous membranes moist NECK: supple w/o LA CV: rrr.   PULM: ctab, no inc wob ABD: soft, +bs EXT: no edema SKIN: no acute rash but some actinic keratoses noted on the face. None are ulcerated.  Knows the year and the month and the day of the week. Does know the season.  3/3 attention.  Can do math and read a watch.  Knows his address and phone number. 3/3 recall.  Understands metaphors, can interpret one.

## 2016-04-28 NOTE — Progress Notes (Signed)
Pre visit review using our clinic review tool, if applicable. No additional management support is needed unless otherwise documented below in the visit note. 

## 2016-04-28 NOTE — Patient Instructions (Signed)
Don't change your meds for now.  Bryan Berry will call about your referral. Bryan LabradorWe'll go from there.   You likely have actinic keratoses on your face.  If they get more irritated, we can set you up with the dermatology clinic.  Take care.  Glad to see you.

## 2016-06-02 ENCOUNTER — Ambulatory Visit: Payer: Self-pay | Admitting: Neurology

## 2016-06-16 ENCOUNTER — Ambulatory Visit (INDEPENDENT_AMBULATORY_CARE_PROVIDER_SITE_OTHER): Payer: Medicare Other | Admitting: Neurology

## 2016-06-16 ENCOUNTER — Encounter: Payer: Self-pay | Admitting: Neurology

## 2016-06-16 VITALS — BP 138/80 | HR 82 | Resp 16 | Ht 69.0 in | Wt 192.0 lb

## 2016-06-16 DIAGNOSIS — F0280 Dementia in other diseases classified elsewhere without behavioral disturbance: Secondary | ICD-10-CM | POA: Diagnosis not present

## 2016-06-16 DIAGNOSIS — G301 Alzheimer's disease with late onset: Secondary | ICD-10-CM | POA: Diagnosis not present

## 2016-06-16 MED ORDER — MEMANTINE HCL 28 X 5 MG & 21 X 10 MG PO TABS: ORAL_TABLET | ORAL | 0 refills | Status: DC

## 2016-06-16 NOTE — Progress Notes (Signed)
Reason for visit: Alzheimer's disease  Referring physician: Dr. Aldine Contesuncan  Bryan Berry is a 70 y.o. male  History of present illness:  Bryan Berry is a 70 year old right-handed white male with a history of a progressive memory disturbance that began in November 2016. Over the last year, the patient has had significant progression of memory to the point where he is sundowning now on a regular basis. The patient was evaluated in April 2017 after an episode of severe confusion when he did not recognize his wife and had some paranoid delusions. The patient underwent a CT scan of the brain that was unremarkable. The patient cleared from this, but he has now continued to progress with the memory. The patient has short-term memory issues, he has difficulty with names. He is still driving, but he is having some problems with directions. He does not do the finances and he does not generally manage medications or appointments. The patient is no longer reading as much as he used to. More recently with the sundowning episodes he may not recognize his house and he may not recognize his wife. During the daytime, he generally does much better. He has a history of sleep apnea, but he has not been able to tolerate the CPAP. He generally sleeps fairly well at night, he may take a nap in the midafternoon. He denies any issues controlling the bowels or bladder or problems with balance. He is sent to this office for an evaluation. Blood work to include a thyroid profile and a B12 level have been done and are unremarkable.  Past Medical History:  Diagnosis Date  . Allergy   . Memory loss   . UTI (lower urinary tract infection)    one episode    Past Surgical History:  Procedure Laterality Date  . VARICOSE VEIN SURGERY      Family History  Problem Relation Age of Onset  . Heart disease Father     Social history:  reports that he has quit smoking. He has never used smokeless tobacco. He reports that he  drinks alcohol. He reports that he does not use drugs.  Medications:  Prior to Admission medications   Medication Sig Start Date End Date Taking? Authorizing Provider  Ascorbic Acid (VITAMIN C) 1000 MG tablet Take 1,000 mg by mouth daily.   Yes Historical Provider, MD  Cholecalciferol (VITAMIN D-3) 1000 units CAPS Take by mouth.   Yes Historical Provider, MD  co-enzyme Q-10 30 MG capsule Take 30 mg by mouth daily.   Yes Historical Provider, MD  donepezil (ARICEPT) 10 MG tablet Take 1 tablet (10 mg total) by mouth at bedtime. 12/07/15  Yes Joaquim NamGraham S Duncan, MD  Garlic 1000 MG CAPS Take by mouth.   Yes Historical Provider, MD  L-Lysine 1000 MG TABS Take by mouth.   Yes Historical Provider, MD  Misc Natural Products (SAW PALMETTO) CAPS Take 1 capsule by mouth daily.   Yes Historical Provider, MD  Multiple Vitamin (MULTIVITAMIN WITH MINERALS) TABS tablet Take 1 tablet by mouth daily.   Yes Historical Provider, MD  Turmeric, Corwin Levinsurcuma Longa, (CURCUMIN) POWD by Does not apply route.   Yes Historical Provider, MD  vitamin B-12 (CYANOCOBALAMIN) 1000 MCG tablet Take 1,000 mcg by mouth daily.   Yes Historical Provider, MD  vitamin E 400 UNIT capsule Take 400 Units by mouth daily.   Yes Historical Provider, MD  zinc gluconate 50 MG tablet Take 50 mg by mouth daily.   Yes Historical Provider, MD  memantine (NAMENDA TITRATION PAK) tablet pack 5 mg/day for =1 week; 5 mg twice daily for =1 week; 15 mg/day given in 5 mg and 10 mg separated doses for =1 week; then 10 mg twice daily 06/16/16   York Spanielharles K Willis, MD     No Known Allergies  ROS:  Out of a complete 14 system review of symptoms, the patient complains only of the following symptoms, and all other reviewed systems are negative.  Memory loss, confusion  Blood pressure 138/80, pulse 82, resp. rate 16, height 5\' 9"  (1.753 m), weight 192 lb (87.1 kg).  Physical Exam  General: The patient is alert and cooperative at the time of the  examination.  Eyes: Pupils are equal, round, and reactive to light. Discs are flat bilaterally.  Neck: The neck is supple, no carotid bruits are noted.  Respiratory: The respiratory examination is clear.  Cardiovascular: The cardiovascular examination reveals a regular rate and rhythm, no obvious murmurs or rubs are noted.  Skin: Extremities are without significant edema.  Neurologic Exam  Mental status: The patient is alert and oriented x 3 at the time of the examination. The Mini-Mental Status Examination done today shows a total score 21/30.  Cranial nerves: Facial symmetry is present. There is good sensation of the face to pinprick and soft touch bilaterally. The strength of the facial muscles and the muscles to head turning and shoulder shrug are normal bilaterally. Speech is well enunciated, no aphasia or dysarthria is noted. Extraocular movements are full. Visual fields are full. The tongue is midline, and the patient has symmetric elevation of the soft palate. No obvious hearing deficits are noted.  Motor: The motor testing reveals 5 over 5 strength of all 4 extremities. Good symmetric motor tone is noted throughout.  Sensory: Sensory testing is intact to pinprick, soft touch, vibration sensation, and position sense on all 4 extremities. No evidence of extinction is noted.  Coordination: Cerebellar testing reveals good finger-nose-finger and heel-to-shin bilaterally.  Gait and station: Gait is normal. Tandem gait is normal. Romberg is negative. No drift is seen.  Reflexes: Deep tendon reflexes are symmetric and normal bilaterally. Toes are downgoing bilaterally.   Assessment/Plan:  1. Progressive memory disturbance, probable Alzheimer's disease  The patient has had a rapid progression in his memory issues over the last one year. The patient is on Aricept, we will add Namenda to this. The patient could potentially be a candidate for memory research. I will contact our research  coordinator. The patient will follow-up in 6 months.  Marlan Palau. Keith Willis MD 06/16/2016 12:34 PM  [facility] [address] [place], [place] [id number]  Phone [phone] Fax [phone]

## 2016-07-14 ENCOUNTER — Other Ambulatory Visit: Payer: Self-pay | Admitting: Neurology

## 2016-07-14 ENCOUNTER — Other Ambulatory Visit: Payer: Self-pay | Admitting: *Deleted

## 2016-07-14 MED ORDER — MEMANTINE HCL 10 MG PO TABS: 10.0000 mg | ORAL_TABLET | Freq: Two times a day (BID) | ORAL | 6 refills | Status: DC

## 2016-09-08 DIAGNOSIS — H25813 Combined forms of age-related cataract, bilateral: Secondary | ICD-10-CM | POA: Diagnosis not present

## 2016-09-08 DIAGNOSIS — H47322 Drusen of optic disc, left eye: Secondary | ICD-10-CM | POA: Diagnosis not present

## 2016-09-08 DIAGNOSIS — H53452 Other localized visual field defect, left eye: Secondary | ICD-10-CM | POA: Diagnosis not present

## 2016-09-30 DIAGNOSIS — H47322 Drusen of optic disc, left eye: Secondary | ICD-10-CM | POA: Diagnosis not present

## 2016-10-17 ENCOUNTER — Telehealth: Payer: Self-pay

## 2016-10-17 NOTE — Telephone Encounter (Signed)
Bryan Berry request refill donepezil; pt last saw Dr Anne Hahn and has f/u 12/19/16. Transferred call to Dr Anne Hahn office.

## 2016-10-20 ENCOUNTER — Telehealth: Payer: Self-pay | Admitting: Neurology

## 2016-10-20 MED ORDER — DONEPEZIL HCL 10 MG PO TABS
10.0000 mg | ORAL_TABLET | Freq: Every day | ORAL | 1 refills | Status: DC
Start: 2016-10-20 — End: 2016-12-19

## 2016-10-20 NOTE — Telephone Encounter (Signed)
Dr Anne Hahn- FYI only. This is done. Thank you

## 2016-10-20 NOTE — Telephone Encounter (Signed)
E-scribed refills to pt pharmacy as requested.  

## 2016-10-20 NOTE — Telephone Encounter (Signed)
Pt's wife called said she spoke with someone in clinic on Monday reg refill for donepezil (ARICEPT) 10 MG tablet . Pt will run out of this medication the end of next week. Please send to [facility]. She said Dr Burke Keels said Dr Lacretia Nicks would need to refill from now on. Please call to discuss ( I did see a note from PCP stating the call was transferred to clinic on Monday 4/23 but there is no documentation that the call was rec'd on our end)

## 2016-11-04 DIAGNOSIS — H47322 Drusen of optic disc, left eye: Secondary | ICD-10-CM | POA: Diagnosis not present

## 2016-11-09 ENCOUNTER — Telehealth: Payer: Self-pay | Admitting: Neurology

## 2016-11-09 NOTE — Telephone Encounter (Signed)
[facility] called requesting Dr. Anne HahnWillis to call back Dr. Kieth Brightlyrlando Alvarez.  Please call [phone]

## 2016-11-09 NOTE — Telephone Encounter (Signed)
I called Dr. Onalee HuaAlvarez. 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 He wanted to make sure that the patient has had a scan of the brain done recently, the last study we have is a CT of the brain done 1 year ago.  The visual field issue apparently is quite chronic in nature, likely not related to a brain issue.

## 2016-12-01 IMAGING — CT CT HEAD W/O CM
1 series · 16 of 30 positions shown, 20 images · non-contrast
Comparison: None.

CLINICAL DATA: Confusion since 4 p.m..

EXAM:
CT HEAD WITHOUT CONTRAST
TECHNIQUE: Contiguous axial images were obtained from the base of the skull
through the vertex without intravenous contrast.

[Series 2: head wo · axial · 0.41mm/px · z∈[-181,-37]mm · 16 of 36 slices shown, 20 images]
[im 2/36  brain]
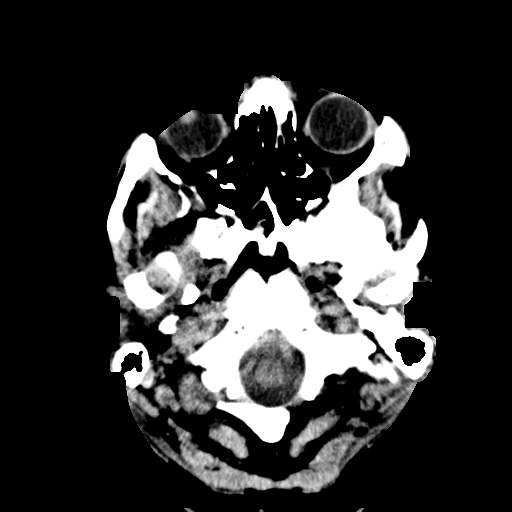
[im 2/36  bone]
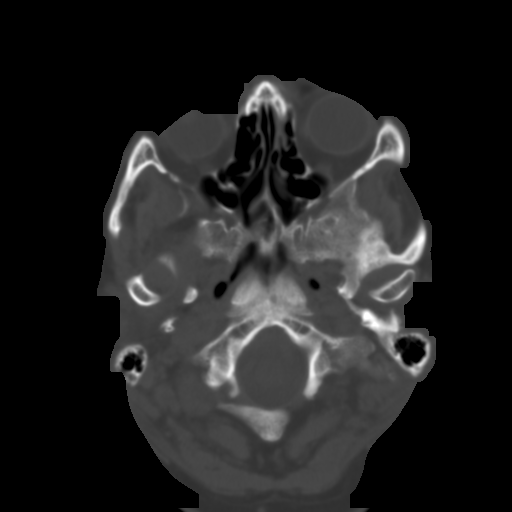
[im 4/36  brain]
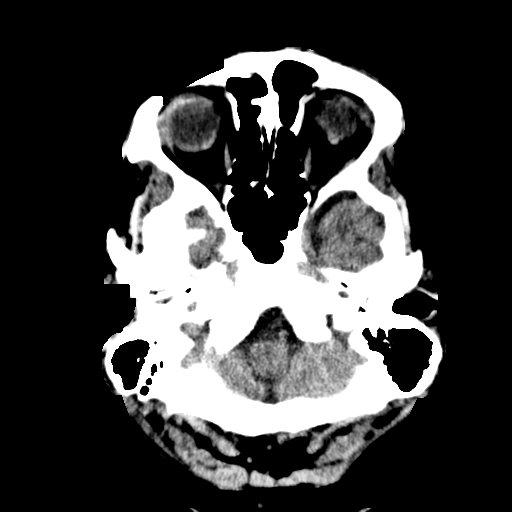
[im 7/36  brain]
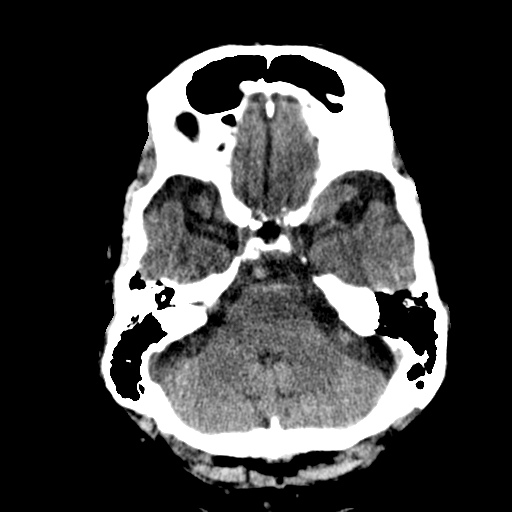
[im 9/36  brain]
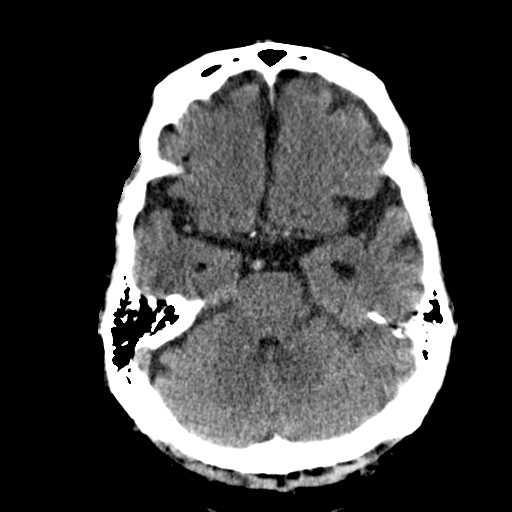
[im 10/36  brain]
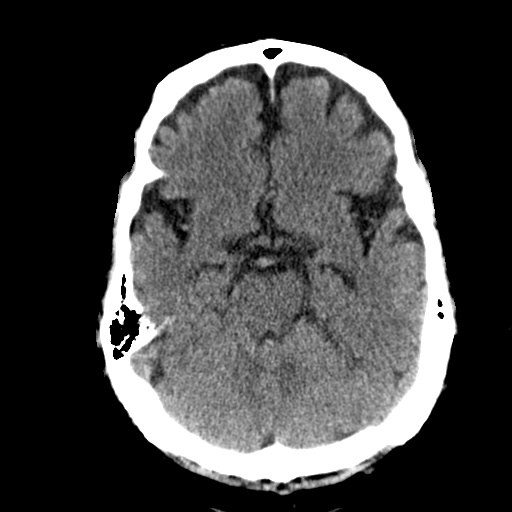
[im 10/36  bone]
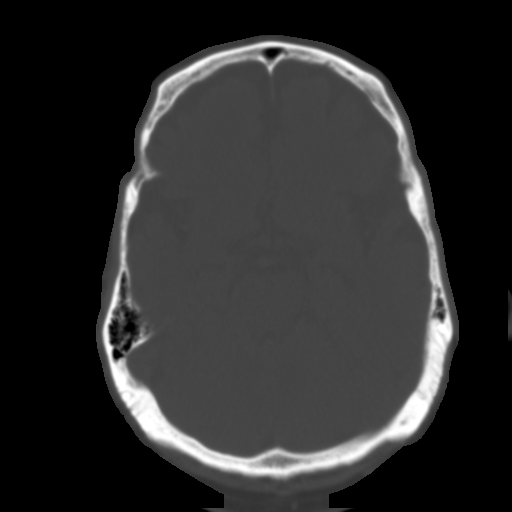
[im 13/36  brain]
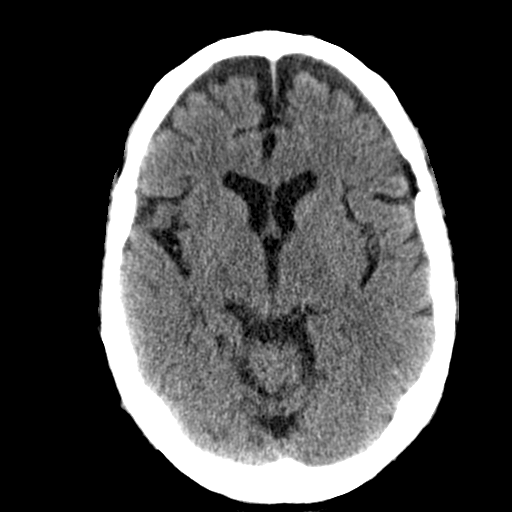
[im 15/36  brain]
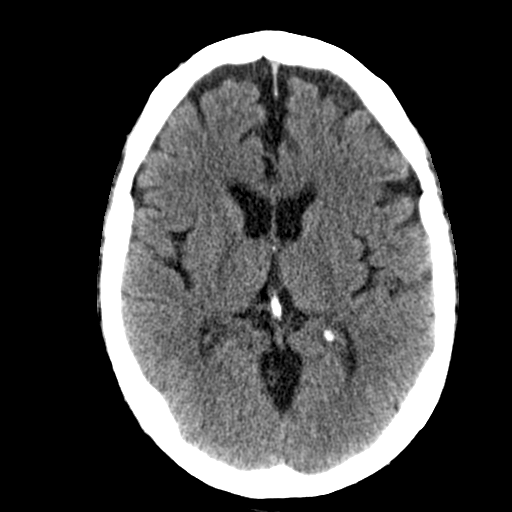
[im 17/36  brain]
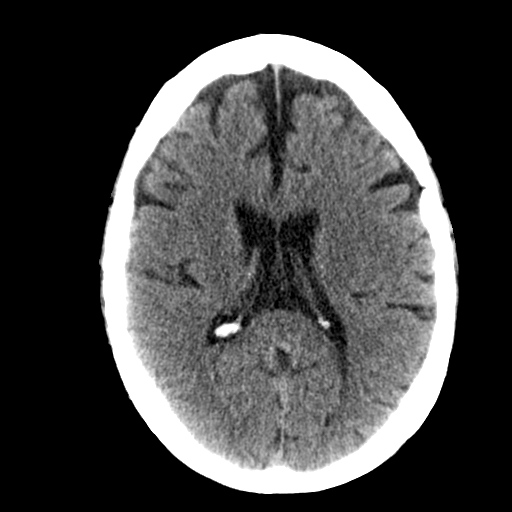
[im 19/36  brain]
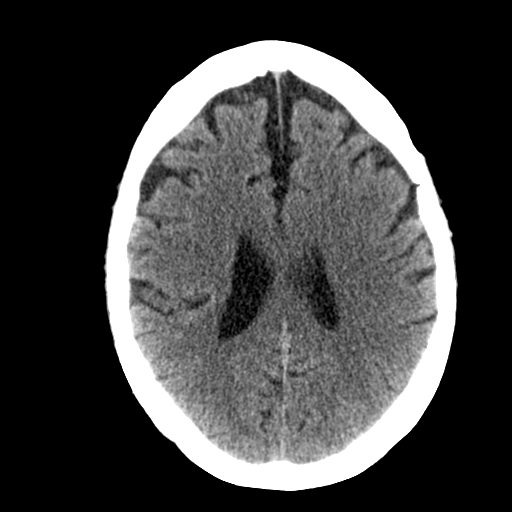
[im 19/36  bone]
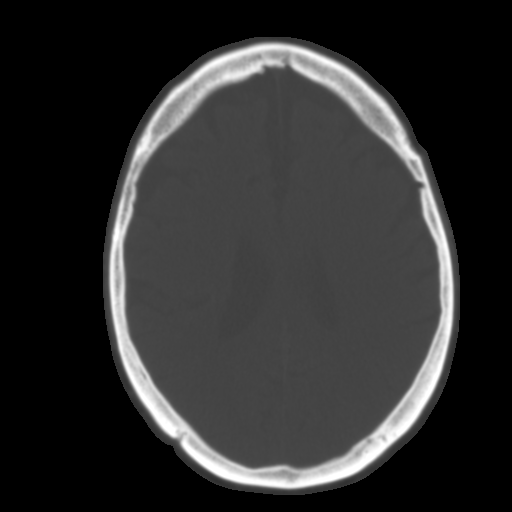
[im 21/36  brain]
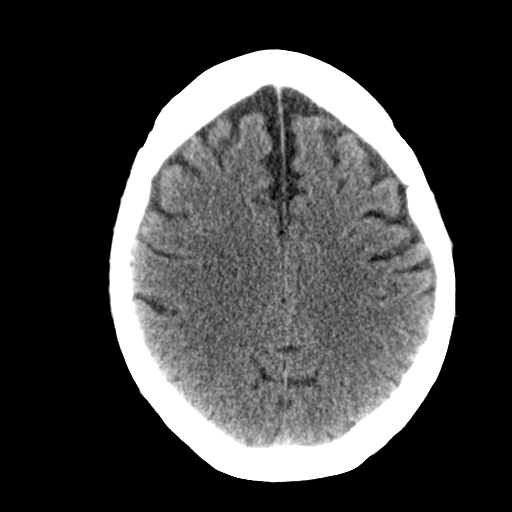
[im 23/36  brain]
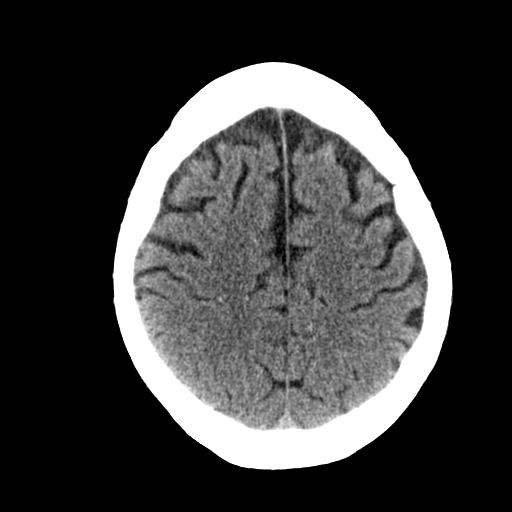
[im 26/36  brain]
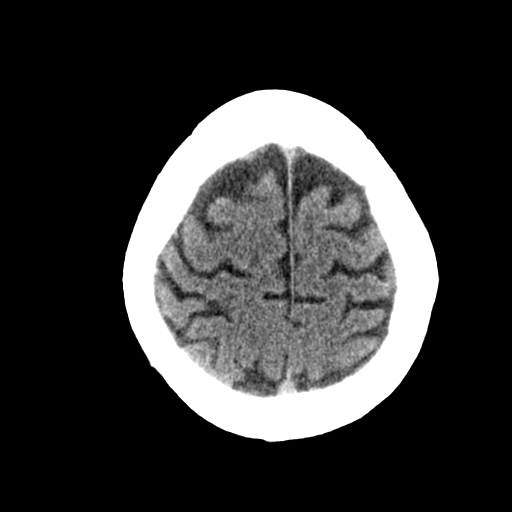
[im 27/36  brain]
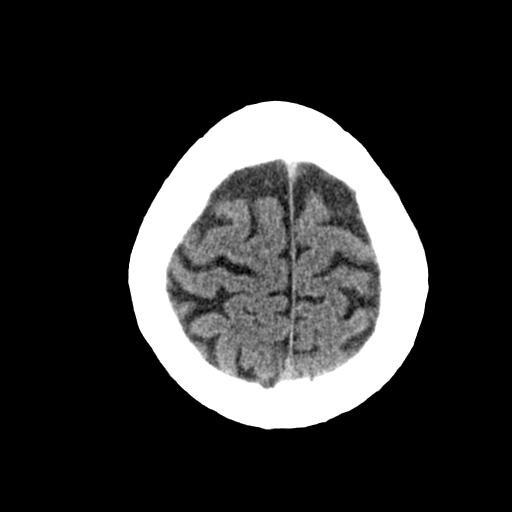
[im 27/36  bone]
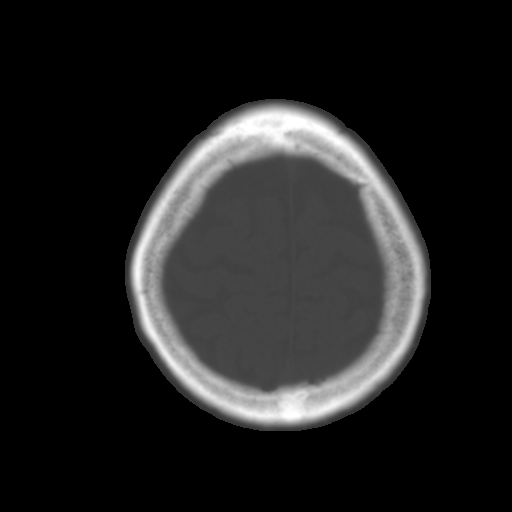
[im 29/36  brain]
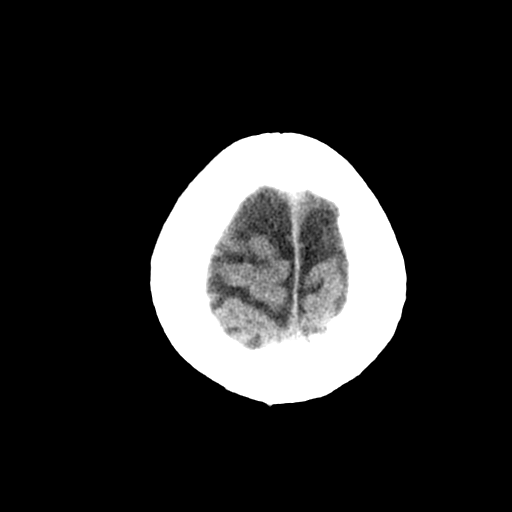
[im 32/36  brain]
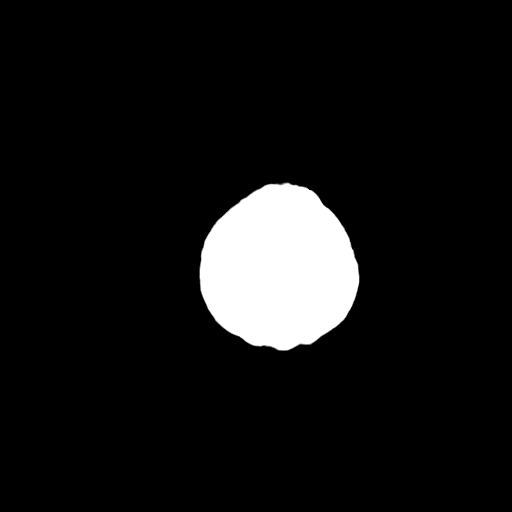
[im 34/36  brain]
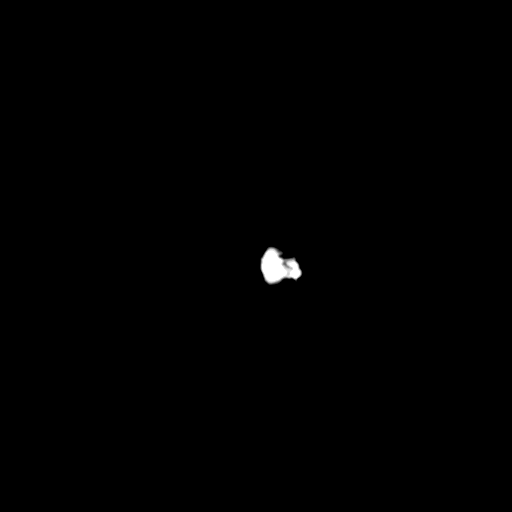

[16 of 30 positions shown; findings below may reference images not displayed]

FINDINGS: Skull and Sinuses:Negative for fracture or destructive process. The
visualized mastoids, middle ears, and imaged paranasal sinuses are
clear.

Visualized orbits: Negative.

Brain: No evidence of acute infarction, hemorrhage, hydrocephalus,
or mass lesion/mass effect. Mild for age generalized volume loss.
IMPRESSION: Negative study.

## 2016-12-02 DIAGNOSIS — Z8669 Personal history of other diseases of the nervous system and sense organs: Secondary | ICD-10-CM | POA: Diagnosis not present

## 2016-12-02 DIAGNOSIS — H0012 Chalazion right lower eyelid: Secondary | ICD-10-CM | POA: Diagnosis not present

## 2016-12-02 IMAGING — CR DG CHEST 2V
1 series · 2 of 2 positions shown · non-contrast
Comparison: None.

CLINICAL DATA: Acute onset of short-term memory loss. Initial
encounter.

EXAM:
CHEST  2 VIEW

[Series 1: w chest pa · 0.14mm/px · 2 of 2 slices shown]
[im 1/2]
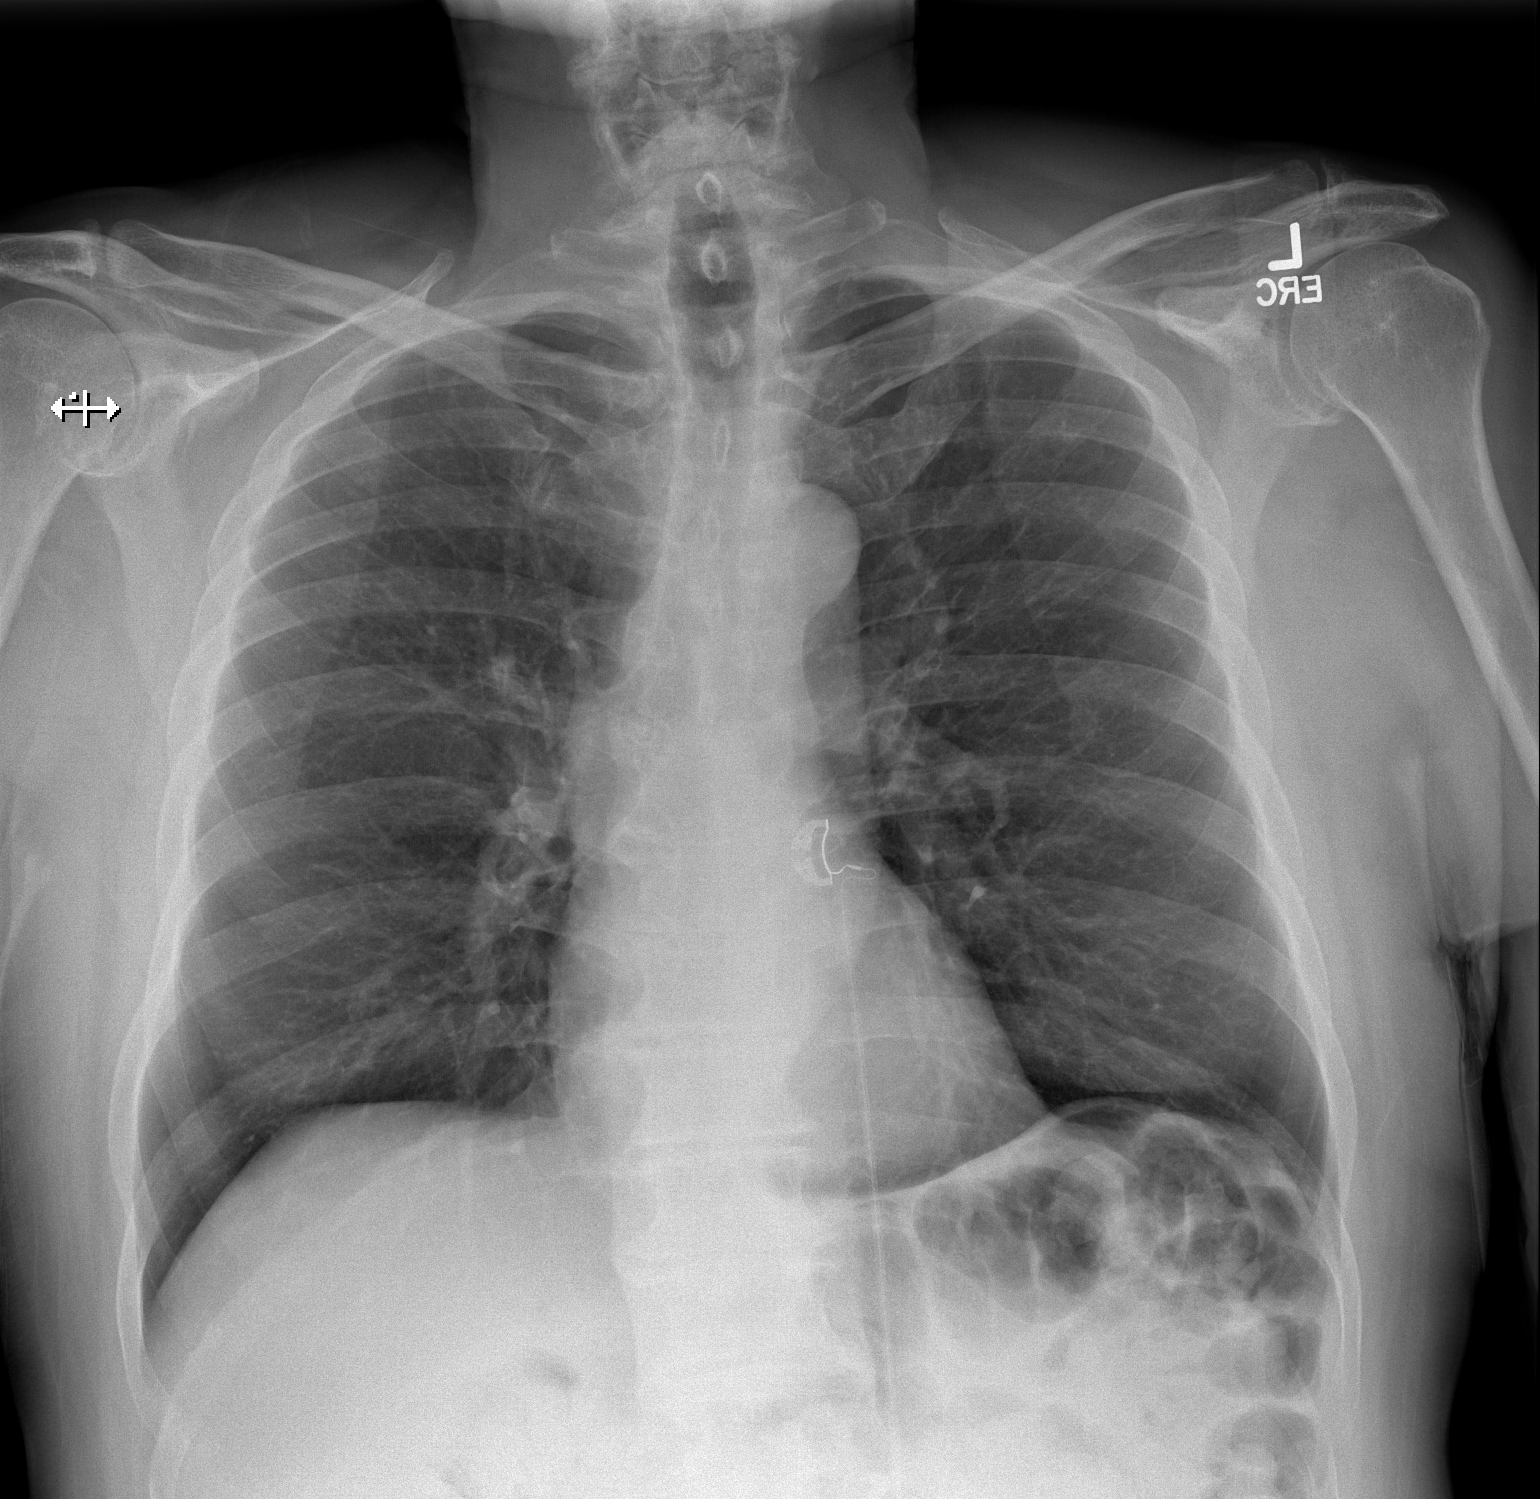
[im 2/2]
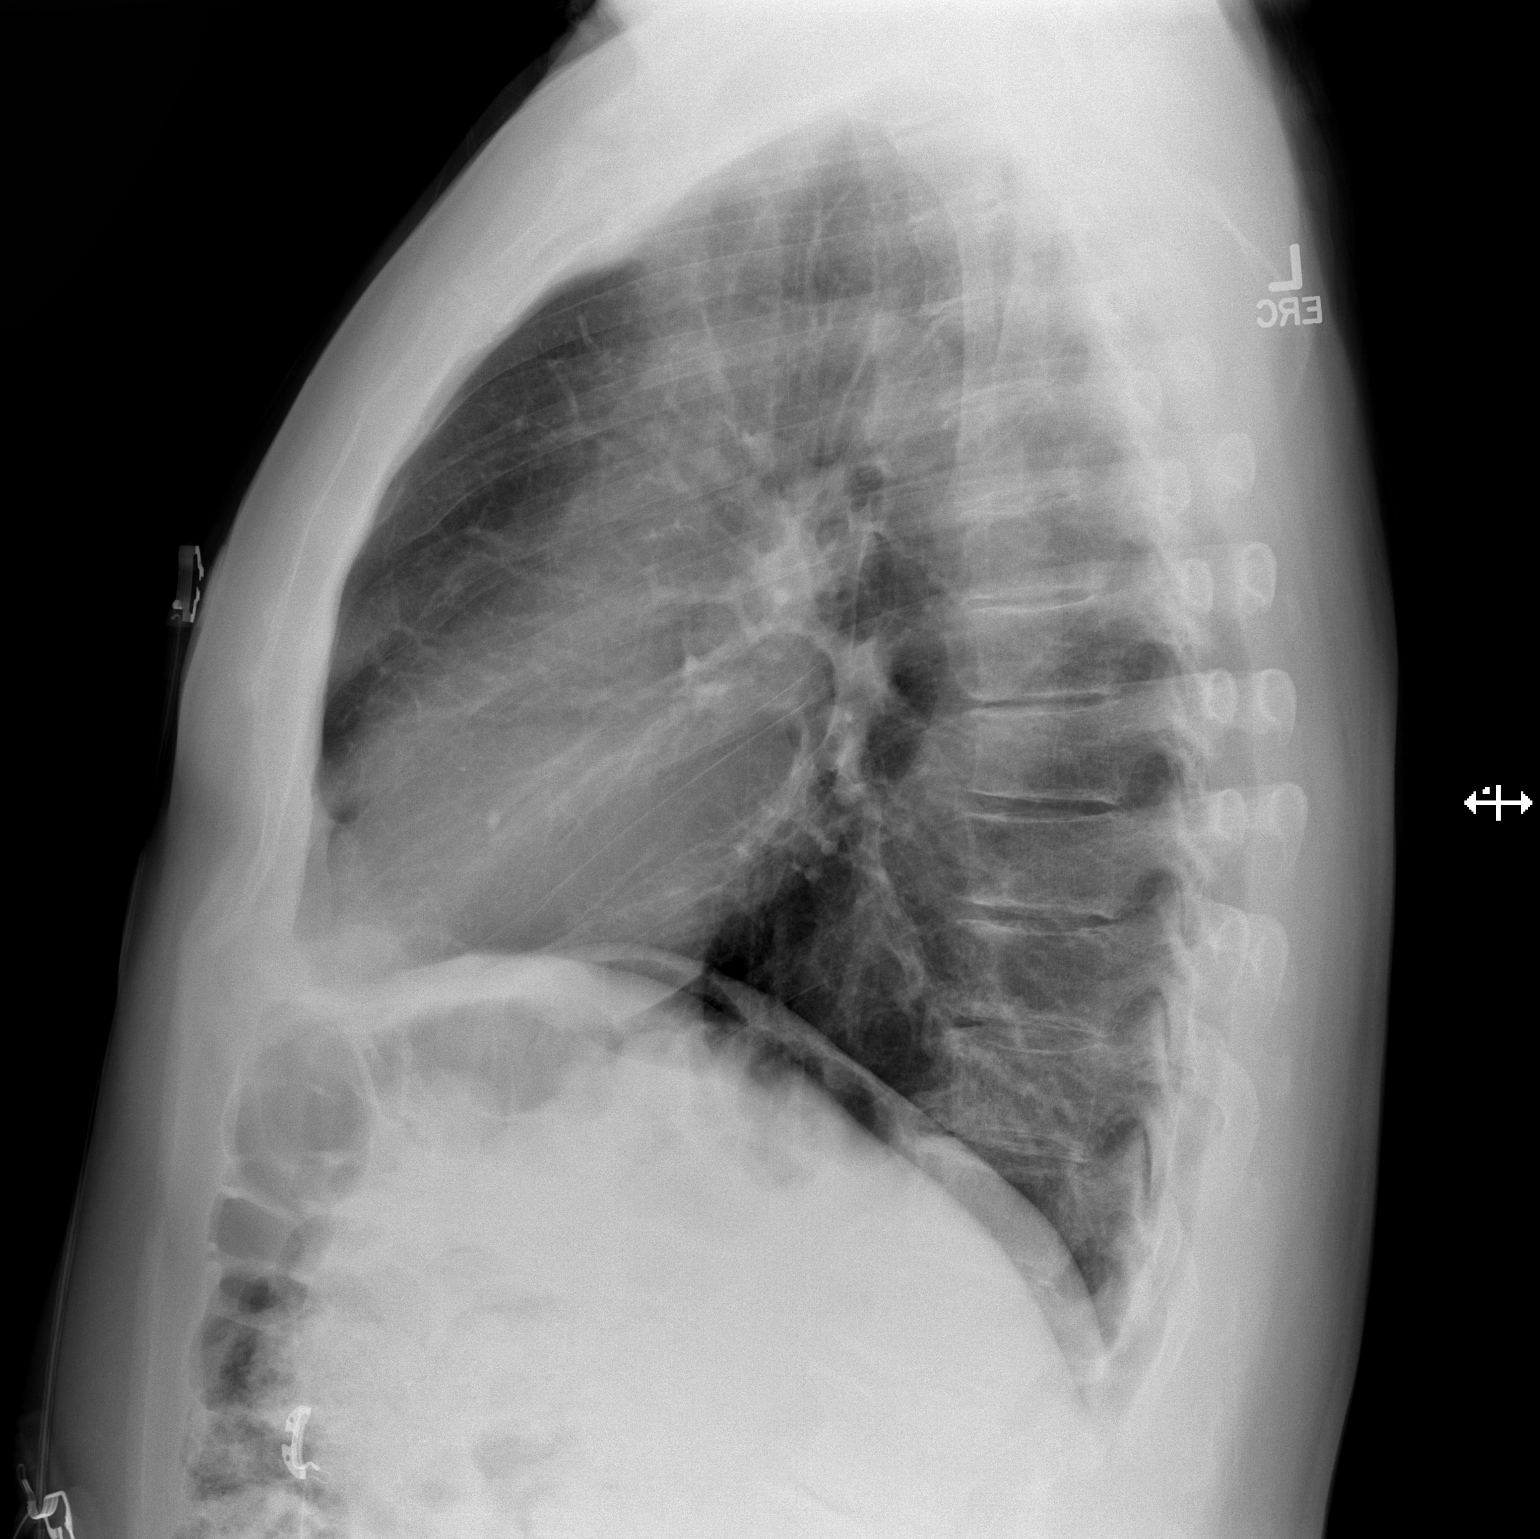

[2 of 2 positions shown; findings below may reference images not displayed]

FINDINGS: The lungs are well-aerated and clear. There is no evidence of focal
opacification, pleural effusion or pneumothorax.

The heart is normal in size; the mediastinal contour is within
normal limits. No acute osseous abnormalities are seen.
IMPRESSION: No acute cardiopulmonary process seen.

## 2016-12-19 ENCOUNTER — Encounter: Payer: Self-pay | Admitting: Neurology

## 2016-12-19 ENCOUNTER — Ambulatory Visit (INDEPENDENT_AMBULATORY_CARE_PROVIDER_SITE_OTHER): Payer: Medicare Other | Admitting: Neurology

## 2016-12-19 VITALS — BP 129/82 | HR 85 | Ht 69.0 in | Wt 196.0 lb

## 2016-12-19 DIAGNOSIS — F0280 Dementia in other diseases classified elsewhere without behavioral disturbance: Secondary | ICD-10-CM | POA: Diagnosis not present

## 2016-12-19 DIAGNOSIS — G301 Alzheimer's disease with late onset: Secondary | ICD-10-CM | POA: Diagnosis not present

## 2016-12-19 MED ORDER — DONEPEZIL HCL 10 MG PO TABS: 10.0000 mg | ORAL_TABLET | Freq: Every day | ORAL | 3 refills | Status: DC

## 2016-12-19 MED ORDER — MEMANTINE HCL 10 MG PO TABS: 10.0000 mg | ORAL_TABLET | Freq: Two times a day (BID) | ORAL | 3 refills | Status: DC

## 2016-12-19 NOTE — Progress Notes (Signed)
Reason for visit: Memory disturbance  Bryan Berry is an 71 y.o. male  History of present illness:  Bryan Berry is a 71 year old right-handed white male with a history of a memory disturbance is progressive. The patient is on Aricept and Namenda, he is having some vivid dreams at night at times, he may act out his dreams. He generally functions better cognitively in the morning, around 3 or 4 in the evening he started getting confused, he may not know where he is, or he may not recognize his wife. He is able to operate a motor vehicle during the day, usually is with his wife while driving. The patient has not given up doing any activities of daily living secondary to memory since last seen. He returns for an evaluation.  Past Medical History:  Diagnosis Date  . Allergy   . Memory loss   . UTI (lower urinary tract infection)    one episode    Past Surgical History:  Procedure Laterality Date  . VARICOSE VEIN SURGERY      Family History  Problem Relation Age of Onset  . Heart disease Father     Social history:  reports that he has quit smoking. He has never used smokeless tobacco. He reports that he drinks alcohol. He reports that he does not use drugs.   No Known Allergies  Medications:  Prior to Admission medications   Medication Sig Start Date End Date Taking? Authorizing Provider  Ascorbic Acid (VITAMIN C) 1000 MG tablet Take 1,000 mg by mouth daily.   Yes [provider]  Cholecalciferol (VITAMIN D-3) 1000 units CAPS Take by mouth.   Yes [provider]  co-enzyme Q-10 30 MG capsule Take 30 mg by mouth daily.   Yes [provider]  donepezil (ARICEPT) 10 MG tablet Take 1 tablet (10 mg total) by mouth at bedtime. 10/20/16  Yes York Spaniel, MD  Garlic 1000 MG CAPS Take by mouth.   Yes [provider]  GLUCOSAMINE-CHONDROITIN PO Take 1 tablet by mouth 2 (two) times daily.   Yes [provider]  L-Lysine 1000 MG TABS Take  500 mg by mouth daily.    Yes [provider]  memantine (NAMENDA TITRATION PAK) tablet pack 5 mg/day for =1 week; 5 mg twice daily for =1 week; 15 mg/day given in 5 mg and 10 mg separated doses for =1 week; then 10 mg twice daily 06/16/16  Yes York Spaniel, MD  memantine (NAMENDA) 10 MG tablet Take 1 tablet (10 mg total) by mouth 2 (two) times daily. 07/14/16  Yes York Spaniel, MD  Multiple Vitamin (MULTIVITAMIN WITH MINERALS) TABS tablet Take 1 tablet by mouth daily.   Yes [provider]  Selenium 200 MCG CAPS Take 1 capsule by mouth daily.   Yes [provider]  Turmeric, Corwin Levins, (CURCUMIN) POWD by Does not apply route.   Yes [provider]  vitamin B-12 (CYANOCOBALAMIN) 1000 MCG tablet Take 1,000 mcg by mouth daily.   Yes [provider]  vitamin E 400 UNIT capsule Take 400 Units by mouth daily.   Yes [provider]  zinc gluconate 50 MG tablet Take 50 mg by mouth daily.   Yes [provider]    ROS:  Out of a complete 14 system review of symptoms, the patient complains only of the following symptoms, and all other reviewed systems are negative.  Runny nose Memory loss, confusion  Blood pressure 129/82, pulse 85,  height 5\' 9"  (1.753 m), weight 196 lb (88.9 kg).  Physical Exam  General: The patient is alert and cooperative at the time of the examination.  Skin: No significant peripheral edema is noted.   Neurologic Exam  Mental status: The patient is alert and oriented x 3 at the time of the examination. The Mini-Mental Status Examination done today shows a total score 22/30.   Cranial nerves: Facial symmetry is present. Speech is normal, no aphasia or dysarthria is noted. Extraocular movements are full. Visual fields are full.  Motor: The patient has good strength in all 4 extremities.  Sensory examination: Soft touch sensation is symmetric on the face, arms, and legs.  Coordination: The  patient has good finger-nose-finger and heel-to-shin bilaterally. Some apraxia with the use of the lower extremities is seen.  Gait and station: The patient has a normal gait. Tandem gait is normal. Romberg is negative. No drift is seen.  Reflexes: Deep tendon reflexes are symmetric.   Assessment/Plan:  1. History of progressive memory disturbance  The patient is still scoring around 22/30 on Mini-Mental status examination, yet he has severe confusion and sundowning in the evenings which is unusual. The patient is not truly agitated, but he may be somewhat anxious at times. If the agitation ensues, medical therapy for this behavior may be warranted. The patient will remain on Aricept, but he will convert the dosing to the morning to see if we can avoid some of the vivid dreams at night. He was given a prescription for Namenda and Aricept today. He will follow-up in 6 months.  Bryan Berry. Bryan Tayquan Gassman MD 12/19/2016 3:37 PM  [facility] [address] [place], [place] [id number]  Phone [phone] Fax [phone]

## 2017-01-09 ENCOUNTER — Telehealth: Payer: Self-pay | Admitting: Neurology

## 2017-01-09 MED ORDER — ESCITALOPRAM OXALATE 10 MG PO TABS: 10.0000 mg | ORAL_TABLET | Freq: Every day | ORAL | 3 refills | Status: DC

## 2017-01-09 NOTE — Telephone Encounter (Signed)
Patient goes to his eye dr on Wednesday and wife said he may be possibly going to have cataract surgery and would like to make sure this is okay with Dr. Anne HahnWillis.  Please call

## 2017-01-09 NOTE — Telephone Encounter (Signed)
I called the patient. No contraindication to having cataract surgery, they also indicate that they need a medication for anxiety, I will call in low dose Lexapro.

## 2017-01-09 NOTE — Addendum Note (Signed)
Addended by: York SpanielWILLIS, Quantavius Humm K on: 01/09/2017 05:07 PM   Modules accepted: Orders

## 2017-01-09 NOTE — Telephone Encounter (Signed)
Patient wife called office in reference to patient last office visit with Dr. Anne HahnWillis.  She would to speak with RN in reference to possibly starting patient on an anxiety medication.  [facility]  Please call

## 2017-01-09 NOTE — Telephone Encounter (Signed)
I called in a prescription for Lexapro.

## 2017-01-24 NOTE — Telephone Encounter (Signed)
I called regarding the use of Lexapro. If patient and family desire, they can start on a half a tablet daily first. This is not an antipsychotic drug, if he does have adverse side effects the medication can be discontinued and the side effects will go away.  They will contact me if the need any further questions or concerns addressed.

## 2017-01-24 NOTE — Telephone Encounter (Signed)
Patient's wife calling to discuss side effects of escitalopram (LEXAPRO) 10 MG tablet. She has been scared to give him this medication. Please call back after 2:30pm.

## 2017-02-08 DIAGNOSIS — H0012 Chalazion right lower eyelid: Secondary | ICD-10-CM | POA: Diagnosis not present

## 2017-02-08 DIAGNOSIS — G3109 Other frontotemporal dementia: Secondary | ICD-10-CM | POA: Diagnosis not present

## 2017-02-08 DIAGNOSIS — H47322 Drusen of optic disc, left eye: Secondary | ICD-10-CM | POA: Diagnosis not present

## 2017-02-09 ENCOUNTER — Encounter: Payer: Self-pay | Admitting: Family Medicine

## 2017-02-09 ENCOUNTER — Ambulatory Visit (INDEPENDENT_AMBULATORY_CARE_PROVIDER_SITE_OTHER): Payer: Medicare Other | Admitting: Family Medicine

## 2017-02-09 VITALS — BP 142/84 | HR 71 | Temp 97.9°F | Ht 69.0 in | Wt 195.0 lb

## 2017-02-09 DIAGNOSIS — Z23 Encounter for immunization: Secondary | ICD-10-CM | POA: Diagnosis not present

## 2017-02-09 DIAGNOSIS — R413 Other amnesia: Secondary | ICD-10-CM | POA: Diagnosis not present

## 2017-02-09 DIAGNOSIS — Z1159 Encounter for screening for other viral diseases: Secondary | ICD-10-CM | POA: Diagnosis not present

## 2017-02-09 DIAGNOSIS — F0280 Dementia in other diseases classified elsewhere without behavioral disturbance: Secondary | ICD-10-CM

## 2017-02-09 DIAGNOSIS — Z0000 Encounter for general adult medical examination without abnormal findings: Secondary | ICD-10-CM

## 2017-02-09 DIAGNOSIS — G301 Alzheimer's disease with late onset: Secondary | ICD-10-CM

## 2017-02-09 DIAGNOSIS — Z7189 Other specified counseling: Secondary | ICD-10-CM

## 2017-02-09 DIAGNOSIS — Z13220 Encounter for screening for lipoid disorders: Secondary | ICD-10-CM | POA: Diagnosis not present

## 2017-02-09 NOTE — Patient Instructions (Addendum)
Go to the lab on the way out.  We'll contact you with your lab report.  Check with your insurance to see if they will cover a tetanus (Tdap) and shigrix shot. I would get a flu shot each fall.    It would be okay to start the lexapro at a half dose.  It usually takes about 4 weeks to notice an effect.   Take care.  Glad to see you.

## 2017-02-10 DIAGNOSIS — Z0000 Encounter for general adult medical examination without abnormal findings: Secondary | ICD-10-CM | POA: Insufficient documentation

## 2017-02-10 DIAGNOSIS — Z7189 Other specified counseling: Secondary | ICD-10-CM | POA: Insufficient documentation

## 2017-02-10 LAB — CBC WITH DIFFERENTIAL/PLATELET
BASOS ABS: 0 10*3/uL (ref 0.0–0.1)
Basophils Relative: 0.8 % (ref 0.0–3.0)
EOS ABS: 0.2 10*3/uL (ref 0.0–0.7)
Eosinophils Relative: 3.9 % (ref 0.0–5.0)
HEMATOCRIT: 45.1 % (ref 39.0–52.0)
Hemoglobin: 15 g/dL (ref 13.0–17.0)
LYMPHS PCT: 19.2 % (ref 12.0–46.0)
Lymphs Abs: 1.1 10*3/uL (ref 0.7–4.0)
MCHC: 33.2 g/dL (ref 30.0–36.0)
MCV: 97 fl (ref 78.0–100.0)
MONO ABS: 0.6 10*3/uL (ref 0.1–1.0)
Monocytes Relative: 10.5 % (ref 3.0–12.0)
NEUTROS ABS: 3.8 10*3/uL (ref 1.4–7.7)
Neutrophils Relative %: 65.6 % (ref 43.0–77.0)
PLATELETS: 195 10*3/uL (ref 150.0–400.0)
RBC: 4.65 Mil/uL (ref 4.22–5.81)
RDW: 13.3 % (ref 11.5–15.5)
WBC: 5.9 10*3/uL (ref 4.0–10.5)

## 2017-02-10 LAB — COMPREHENSIVE METABOLIC PANEL
ALBUMIN: 4 g/dL (ref 3.5–5.2)
ALT: 14 U/L (ref 0–53)
AST: 20 U/L (ref 0–37)
Alkaline Phosphatase: 76 U/L (ref 39–117)
BILIRUBIN TOTAL: 0.7 mg/dL (ref 0.2–1.2)
BUN: 20 mg/dL (ref 6–23)
CALCIUM: 9.4 mg/dL (ref 8.4–10.5)
CO2: 29 mEq/L (ref 19–32)
CREATININE: 0.95 mg/dL (ref 0.40–1.50)
Chloride: 105 mEq/L (ref 96–112)
GFR: 82.89 mL/min (ref >60.00)
Glucose, Bld: 81 mg/dL (ref 70–99)
Potassium: 4.9 mEq/L (ref 3.5–5.1)
Sodium: 142 mEq/L (ref 135–145)
Total Protein: 6.2 g/dL (ref 6.0–8.3)

## 2017-02-10 LAB — LIPID PANEL
CHOL/HDL RATIO: 4
CHOLESTEROL: 201 mg/dL — AB (ref 0–200)
HDL: 52.9 mg/dL (ref >39.00)
LDL Cholesterol: 129 mg/dL — ABNORMAL HIGH (ref 0–99)
NonHDL: 147.88
TRIGLYCERIDES: 92 mg/dL (ref 0.0–149.0)
VLDL: 18.4 mg/dL (ref 0.0–40.0)

## 2017-02-10 NOTE — Assessment & Plan Note (Signed)
Flu encoruaged Shingles d/w pt at and wife, see AVS.  PNA 2018 Tetanus d/w pt and wife, see AVS Colonoscopy 2017 Prostate cancer screening and PSA options (with potential risks and benefits of testing vs not testing) were discussed along with recent recs/guidelines.  He declined testing PSA at this point. Advance directive- wife designated if patient were incapacitated Cognitive function addressed- see scanned forms- and if abnormal then additional documentation follows- see below.  Due for routine labs. See notes on labs. Lipids screening pending. Pt opts in for HCV screening.  D/w pt re: routine screening.

## 2017-02-10 NOTE — Assessment & Plan Note (Signed)
Continue supervision at home, continue Aricept and Namenda. He has sundowning. Reasonable to try Lexapro to see if that helps with sundowning. Update me as needed. Support offered. See notes on labs.

## 2017-02-10 NOTE — Progress Notes (Signed)
I have personally reviewed the Medicare Annual Wellness questionnaire and have noted 1. The patient's medical and social history 2. Their use of alcohol, tobacco or illicit drugs 3. Their current medications and supplements 4. The patient's functional ability including ADL's, fall risks, home safety risks and hearing or visual             impairment. 5. Diet and physical activities 6. Evidence for depression or mood disorders  The patients weight, height, BMI have been recorded in the chart and visual acuity is per eye clinic.  I have made referrals, counseling and provided education to the patient based review of the above and I have provided the pt with a written personalized care plan for preventive services.  Provider list updated- see scanned forms.  Routine anticipatory guidance given to patient.  See health maintenance. The possibility exists that previously documented standard health maintenance information may have been brought forward from a previous encounter into this note.  If needed, that same information has been updated to reflect the current situation based on today's encounter.    Flu encoruaged Shingles d/w pt at and wife, see AVS.  PNA 2018 Tetanus d/w pt and wife, see AVS Colonoscopy 2017 Prostate cancer screening and PSA options (with potential risks and benefits of testing vs not testing) were discussed along with recent recs/guidelines.  He declined testing PSA at this point. Advance directive- wife designated if patient were incapacitated Cognitive function addressed- see scanned forms- and if abnormal then additional documentation follows- see below.  Due for routine labs. See notes on labs. Lipids screening pending. Pt opts in for HCV screening.  D/w pt re: routine screening.    Memory loss, dementia. Still on Aricept and Namenda. He does relatively better during the day but has sundowning at night. He will say "I want to go home" even know he is still in his  house. He will not recognize his wife at that point. She tries to reorient him. He is compliant with current medications. Changing donepezil to earlier in the day helped with sleep changes. He sleeping better now. Wife supervises patient. He does not drive. She assists him with activities in the home. He still able to get up and walk and feed himself and get dressed but would still need supervision otherwise. They have not yet tried Lexapro. Wife was concerned about potential for side effects. Discussed with patient and wife about risk versus benefit of medication. This medication is generally well-tolerated and may be very useful for this patient, especially since he seems to get more anxious when he begins to sundown.  PMH and SH reviewed  Meds, vitals, and allergies reviewed.   ROS: Per HPI.  Unless specifically indicated otherwise in HPI, the patient denies:  General: fever. Eyes: acute vision changes ENT: sore throat Cardiovascular: chest pain Respiratory: SOB GI: vomiting GU: dysuria Musculoskeletal: acute back pain Derm: acute rash Neuro: acute motor dysfunction Psych: worsening mood Endocrine: polydipsia Heme: bleeding Allergy: hayfever  GEN: nad, alert and but not oriented, pleasant in conversation HEENT: mucous membranes moist NECK: supple w/o LA CV: rrr. PULM: ctab, no inc wob ABD: soft, +bs EXT: no edema SKIN: no acute rash

## 2017-02-10 NOTE — Assessment & Plan Note (Signed)
Advance directive- wife designated if patient were incapacitated.  

## 2017-02-11 LAB — HEPATITIS C ANTIBODY: HCV Ab: NONREACTIVE

## 2017-04-17 ENCOUNTER — Telehealth: Payer: Self-pay | Admitting: Neurology

## 2017-04-17 MED ORDER — RISPERIDONE 0.25 MG PO TABS: 0.2500 mg | ORAL_TABLET | Freq: Two times a day (BID) | ORAL | 3 refills | Status: DC

## 2017-04-17 NOTE — Telephone Encounter (Signed)
I called the patient, talked with the wife. The patient is having increasing problems with agitation, hallucinations, sundowning. He may have confusion and agitation in the morning or in the evening.  I will add Risperdal and low dose taking 0.25 mg twice daily. She will call for any dose adjustments.  He remains on Aricept, Namenda, and Lexapro.

## 2017-04-17 NOTE — Telephone Encounter (Signed)
Pt's wife called said about every other day he gets anxious, doesn't recognize who she is, doesn't know his home and thinks she is holding him captive, and gets angry, this was happening late evening but now it is anytime of the day. He does not remember the next day what he's said and done. Wife is tear-eyed, she said this is getting more frequent. Please call to discuss

## 2017-04-25 DIAGNOSIS — J309 Allergic rhinitis, unspecified: Secondary | ICD-10-CM | POA: Diagnosis not present

## 2017-04-25 DIAGNOSIS — F0390 Unspecified dementia without behavioral disturbance: Secondary | ICD-10-CM | POA: Diagnosis not present

## 2017-05-09 DIAGNOSIS — Z23 Encounter for immunization: Secondary | ICD-10-CM | POA: Diagnosis not present

## 2017-05-17 DIAGNOSIS — H47323 Drusen of optic disc, bilateral: Secondary | ICD-10-CM | POA: Diagnosis not present

## 2017-06-07 ENCOUNTER — Other Ambulatory Visit: Payer: Self-pay | Admitting: Neurology

## 2017-06-29 ENCOUNTER — Telehealth: Payer: Self-pay | Admitting: Internal Medicine

## 2017-06-29 NOTE — Telephone Encounter (Signed)
Isnt using machine doesn't need appt per wife   Deleting recall per Request

## 2017-07-03 ENCOUNTER — Encounter: Payer: Self-pay | Admitting: Adult Health

## 2017-07-03 ENCOUNTER — Ambulatory Visit (INDEPENDENT_AMBULATORY_CARE_PROVIDER_SITE_OTHER): Payer: Medicare Other | Admitting: Adult Health

## 2017-07-03 ENCOUNTER — Encounter (INDEPENDENT_AMBULATORY_CARE_PROVIDER_SITE_OTHER): Payer: Self-pay

## 2017-07-03 VITALS — BP 150/88 | HR 82 | Wt 199.4 lb

## 2017-07-03 DIAGNOSIS — G309 Alzheimer's disease, unspecified: Secondary | ICD-10-CM

## 2017-07-03 DIAGNOSIS — F0281 Dementia in other diseases classified elsewhere with behavioral disturbance: Secondary | ICD-10-CM | POA: Diagnosis not present

## 2017-07-03 MED ORDER — MEMANTINE HCL 10 MG PO TABS: 10.0000 mg | ORAL_TABLET | Freq: Two times a day (BID) | ORAL | 3 refills | Status: DC

## 2017-07-03 MED ORDER — ESCITALOPRAM OXALATE 10 MG PO TABS: 10.0000 mg | ORAL_TABLET | Freq: Every day | ORAL | 3 refills | Status: DC

## 2017-07-03 NOTE — Progress Notes (Signed)
PATIENT: Bryan Berry DOB: 01-29-1946  REASON FOR VISIT: follow up-memory disturbance HISTORY FROM: patient  HISTORY OF PRESENT ILLNESS: Today 07/03/17 Bryan Berry is a 72 year old male with a history of memory disturbance.  He returns today for follow-up.  He is currently on Aricept and Namenda and is tolerating most of the medications well.  His wife is with him today.  He reports that the agitation is slightly better with Risperdal.  She states that he is not as angry as he was when he before when agitated.  She states that he continues to get agitated and is not consolable during those times.  She states often they occur later in the evenings.  He sometimes will feel that he is not at his home.  She does state that when he does go to sleep he sleeps through the night.  He is able to complete most ADLs independently.  On occasion he needs some help with dressing.  His wife reports that she took over the finances 2 years ago.  She does all the cooking.  He is not currently operating a motor vehicle.  He returns today for an evaluation.  HISTORY Bryan Berry is a 72 year old right-handed white male with a history of a memory disturbance is progressive. The patient is on Aricept and Namenda, he is having some vivid dreams at night at times, he may act out his dreams. He generally functions better cognitively in the morning, around 3 or 4 in the evening he started getting confused, he may not know where he is, or he may not recognize his wife. He is able to operate a motor vehicle during the day, usually is with his wife while driving. The patient has not given up doing any activities of daily living secondary to memory since last seen. He returns for an evaluation.  REVIEW OF SYSTEMS: Out of a complete 14 system review of symptoms, the patient complains only of the following symptoms, and all other reviewed systems are negative.  Behavior problems, nervous/anxious, runny nose, unexpected weight  change  ALLERGIES: No Known Allergies  HOME MEDICATIONS: Outpatient Medications Prior to Visit  Medication Sig Dispense Refill  . Ascorbic Acid (VITAMIN C) 1000 MG tablet Take 1,000 mg by mouth daily.    . Cholecalciferol (VITAMIN D-3) 1000 units CAPS Take by mouth.    . co-enzyme Q-10 30 MG capsule Take 30 mg by mouth daily.    Marland Kitchen donepezil (ARICEPT) 10 MG tablet Take 1 tablet (10 mg total) by mouth at bedtime. 90 tablet 3  . escitalopram (LEXAPRO) 10 MG tablet TAKE 1 TABLET BY MOUTH ONCE DAILY 90 tablet 0  . Garlic 1000 MG CAPS Take by mouth.    Marland Kitchen GLUCOSAMINE-CHONDROITIN PO Take 1 tablet by mouth 2 (two) times daily.    Marland Kitchen L-Lysine 1000 MG TABS Take 500 mg by mouth daily.     Marland Kitchen levocetirizine (XYZAL) 5 MG tablet daily.    . memantine (NAMENDA) 10 MG tablet Take 1 tablet (10 mg total) by mouth 2 (two) times daily. 180 tablet 3  . Multiple Vitamin (MULTIVITAMIN WITH MINERALS) TABS tablet Take 1 tablet by mouth daily.    . risperiDONE (RISPERDAL) 0.25 MG tablet Take 1 tablet (0.25 mg total) by mouth 2 (two) times daily. 60 tablet 3  . Selenium 200 MCG CAPS Take 1 capsule by mouth daily.    . Turmeric, Curcuma Longa, (CURCUMIN) POWD by Does not apply route.    . vitamin B-12 (CYANOCOBALAMIN) 1000  MCG tablet Take 1,000 mcg by mouth daily.    . vitamin E 400 UNIT capsule Take 400 Units by mouth daily.    Marland Kitchen. zinc gluconate 50 MG tablet Take 50 mg by mouth daily.    Marland Kitchen. neomycin-polymyxin-dexameth (MAXITROL) 0.1 % OINT Place 1 application into the right eye at bedtime.     No facility-administered medications prior to visit.     PAST MEDICAL HISTORY: Past Medical History:  Diagnosis Date  . Allergy   . Memory loss   . UTI (lower urinary tract infection)    one episode    PAST SURGICAL HISTORY: Past Surgical History:  Procedure Laterality Date  . VARICOSE VEIN SURGERY      FAMILY HISTORY: Family History  Problem Relation Age of Onset  . Heart disease Father   . Colon cancer Neg  Hx   . Prostate cancer Neg Hx     SOCIAL HISTORY: Social History   Socioeconomic History  . Marital status: Married    Spouse name: Not on file  . Number of children: 1  . Years of education: Masters   . Highest education level: Not on file  Social Needs  . Financial resource strain: Not on file  . Food insecurity - worry: Not on file  . Food insecurity - inability: Not on file  . Transportation needs - medical: Not on file  . Transportation needs - non-medical: Not on file  Occupational History  . Occupation: Retired  Tobacco Use  . Smoking status: Former Games developermoker  . Smokeless tobacco: Never Used  . Tobacco comment: smoked a pipe, quit over 20 years ago  Substance and Sexual Activity  . Alcohol use: Yes    Alcohol/week: 0.0 oz    Comment: wine occassionally  . Drug use: No  . Sexual activity: Not on file  Other Topics Concern  . 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   MMSE - Mini Mental State Exam 07/03/2017 12/19/2016 06/16/2016  Orientation to time 0 3 4  Orientation to Place 3 4 3   Registration 3 3 3   Attention/ Calculation 1 1 2   Recall 0 2 0  Language- name 2 objects 2 2 2   Language- repeat 1 1 1   Language- follow 3 step command 3 3 3   Language- read & follow direction 1 1 1   Write a sentence 1 1 1   Copy design 0 1 1  Total score 15 22 21     Generalized: Well developed, in no acute distress   Neurological examination  Mentation: Alert. Follows all commands speech and language fluent Cranial nerve II-XII: Pupils were equal round reactive to light. Extraocular movements were full, visual field were full on confrontational test. Facial sensation and strength were normal. Uvula  tongue midline. Head turning and shoulder shrug  were normal and symmetric. Motor: The motor testing reveals 5 over 5 strength of all 4 extremities. Good symmetric motor tone is noted throughout.  Sensory: Sensory testing is intact to soft touch on all 4 extremities. No evidence of extinction is noted.  Coordination: Cerebellar testing reveals good finger-nose-finger and heel-to-shin  bilaterally.  Gait and station: Gait is normal.   DIAGNOSTIC DATA (LABS, IMAGING, TESTING) - I reviewed patient records, labs, notes, testing and imaging myself where available.  Lab Results  Component Value Date   WBC 5.9 02/09/2017   HGB 15.0 02/09/2017   HCT 45.1 02/09/2017   MCV 97.0 02/09/2017   PLT 195.0 02/09/2017      Component Value Date/Time   NA 142 02/09/2017 1716   K 4.9 02/09/2017 1716   CL 105 02/09/2017 1716   CO2 29 02/09/2017 1716   GLUCOSE 81 02/09/2017 1716   BUN 20 02/09/2017 1716   CREATININE 0.95 02/09/2017 1716   CALCIUM 9.4 02/09/2017 1716   PROT 6.2 02/09/2017 1716   ALBUMIN 4.0 02/09/2017 1716   AST 20 02/09/2017 1716   ALT 14 02/09/2017 1716   ALKPHOS 76 02/09/2017 1716   BILITOT 0.7 02/09/2017 1716   GFRNONAA >60 10/23/2015 2330   GFRAA >60 10/23/2015 2330   Lab Results  Component Value Date   CHOL 201 (H) 02/09/2017   HDL 52.90 02/09/2017   LDLCALC 129 (H) 02/09/2017   TRIG 92.0 02/09/2017   CHOLHDL 4 02/09/2017   No results found for: HGBA1C Lab Results  Component Value Date   VITAMINB12 484 10/23/2015   Lab Results  Component Value Date   TSH 2.838 10/23/2015      ASSESSMENT AND PLAN 72 y.o. year old male  has a past medical history of Allergy, Memory loss, and UTI (lower urinary tract infection). here with:  1.  Alzheimer's disease  The patient's memory score has declined since the last visit.  MMSE today is 15 out of 30.  The patient will continue on Aricept and Namenda.  He will continue on Risperdal 0.25 mg twice a day. Continue Lexapro. I  had a long discussion with the wife regarding nonpharmacological ways to deal with agitation.  We also discussed having a sitter come in for 2-3 hours a day to give her a break.  She was given the number to care patrol to use as a potential resource if needed.  Advised that if his symptoms worsen or he develops new symptoms they should let us know.  He will follow-up in 6 months or sooner if needed.  I spent 25 minutes with the patient. 50% of this time was spent discussing plan of care and resources.      Butch Penny, MSN, NP-C 07/03/2017, 1:45 PM [facility] [address] [place], [place] [address] [phone]

## 2017-07-03 NOTE — Patient Instructions (Signed)
Your Plan:  Continue Aricept and namenda Continue Respirdal for agitation  [facility] [employer] [phone]  Thank you for [facility] see [facility] at [facility]. I hope we have been able to provide you high quality care today.  You may receive a patient satisfaction survey over the next few weeks. We would appreciate your feedback and comments so that we may continue to improve ourselves and the health of our patients.

## 2017-07-03 NOTE — Progress Notes (Signed)
I have read the note, and I agree with the clinical assessment and plan.  Alailah Safley K Brunette Lavalle   

## 2017-07-25 DIAGNOSIS — H47322 Drusen of optic disc, left eye: Secondary | ICD-10-CM | POA: Diagnosis not present

## 2017-07-25 DIAGNOSIS — H25813 Combined forms of age-related cataract, bilateral: Secondary | ICD-10-CM | POA: Diagnosis not present

## 2017-08-02 DIAGNOSIS — H2512 Age-related nuclear cataract, left eye: Secondary | ICD-10-CM | POA: Diagnosis not present

## 2017-08-17 DIAGNOSIS — Z87891 Personal history of nicotine dependence: Secondary | ICD-10-CM | POA: Diagnosis not present

## 2017-08-17 DIAGNOSIS — F0280 Dementia in other diseases classified elsewhere without behavioral disturbance: Secondary | ICD-10-CM | POA: Diagnosis not present

## 2017-08-17 DIAGNOSIS — Z7982 Long term (current) use of aspirin: Secondary | ICD-10-CM | POA: Diagnosis not present

## 2017-08-17 DIAGNOSIS — G309 Alzheimer's disease, unspecified: Secondary | ICD-10-CM | POA: Diagnosis not present

## 2017-08-17 DIAGNOSIS — F419 Anxiety disorder, unspecified: Secondary | ICD-10-CM | POA: Diagnosis not present

## 2017-08-17 DIAGNOSIS — H2512 Age-related nuclear cataract, left eye: Secondary | ICD-10-CM | POA: Diagnosis not present

## 2017-08-17 DIAGNOSIS — H259 Unspecified age-related cataract: Secondary | ICD-10-CM | POA: Diagnosis not present

## 2017-08-17 DIAGNOSIS — G4730 Sleep apnea, unspecified: Secondary | ICD-10-CM | POA: Diagnosis not present

## 2017-08-18 ENCOUNTER — Other Ambulatory Visit: Payer: Self-pay | Admitting: Neurology

## 2017-08-25 DIAGNOSIS — H2511 Age-related nuclear cataract, right eye: Secondary | ICD-10-CM | POA: Diagnosis not present

## 2017-09-07 DIAGNOSIS — H2511 Age-related nuclear cataract, right eye: Secondary | ICD-10-CM | POA: Diagnosis not present

## 2017-09-07 DIAGNOSIS — Z7982 Long term (current) use of aspirin: Secondary | ICD-10-CM | POA: Diagnosis not present

## 2017-09-07 DIAGNOSIS — F0280 Dementia in other diseases classified elsewhere without behavioral disturbance: Secondary | ICD-10-CM | POA: Diagnosis not present

## 2017-09-07 DIAGNOSIS — G309 Alzheimer's disease, unspecified: Secondary | ICD-10-CM | POA: Diagnosis not present

## 2017-09-07 DIAGNOSIS — Z9842 Cataract extraction status, left eye: Secondary | ICD-10-CM | POA: Diagnosis not present

## 2017-09-07 DIAGNOSIS — Z79899 Other long term (current) drug therapy: Secondary | ICD-10-CM | POA: Diagnosis not present

## 2017-09-07 DIAGNOSIS — G4730 Sleep apnea, unspecified: Secondary | ICD-10-CM | POA: Diagnosis not present

## 2017-09-07 DIAGNOSIS — F419 Anxiety disorder, unspecified: Secondary | ICD-10-CM | POA: Diagnosis not present

## 2017-09-07 DIAGNOSIS — H259 Unspecified age-related cataract: Secondary | ICD-10-CM | POA: Diagnosis not present

## 2017-09-07 DIAGNOSIS — Z961 Presence of intraocular lens: Secondary | ICD-10-CM | POA: Diagnosis not present

## 2017-09-11 ENCOUNTER — Telehealth: Payer: Self-pay | Admitting: Adult Health

## 2017-09-11 NOTE — Telephone Encounter (Signed)
Pt's wife called he is not sleeping thru the night. He is gettting up between 2-5, then goes back to sleep and gets up at 7am. She is wanting to know if something OTC would help or RX?? Please call to advise

## 2017-09-11 NOTE — Telephone Encounter (Signed)
Spoke with wife, [place] on [place] who stated patient getting up at night is new in the past month. He takes one 2 hour nap in the daytime but otherwise is active. She stated she gets him out often to walk, go places and do activities. She stated if this is the normal progression for him, she needs to know. This RN suggested that memory patient do often have trouble staying asleep all night. This RN advised her Aundra MilletMegan returns to office tomorrow but will gladly send to Dr Anne HahnWillis to address.  Also advised she could call his pharmacist to discuss safe sleep aids with his prescribed medications.  She stated she does not mind waiting for Megan to address. She verbalized understanding, appreciation of call back.

## 2017-09-12 NOTE — Telephone Encounter (Signed)
Called and LVM for her to call office.

## 2017-10-15 DIAGNOSIS — R410 Disorientation, unspecified: Secondary | ICD-10-CM | POA: Diagnosis not present

## 2017-10-15 DIAGNOSIS — R509 Fever, unspecified: Secondary | ICD-10-CM | POA: Diagnosis not present

## 2017-10-24 DIAGNOSIS — Z23 Encounter for immunization: Secondary | ICD-10-CM | POA: Diagnosis not present

## 2017-10-26 ENCOUNTER — Telehealth: Payer: Self-pay | Admitting: Adult Health

## 2017-10-26 NOTE — Telephone Encounter (Signed)
I called the patient and talk with the wife.  The patient on Easter Sunday, 10 or 11 days ago began having a fever of 101.7, and a productive cough.  The patient was drowsy and confused.  They took him to urgent care, chest x-ray was done, blood work was done, urinalysis was done, no source of the fever was seen.  The patient was placed on antibiotics, he has continued to have a cough, no fever.  He remains quite drowsy, not eating or drinking well.  The cough remains productive.  I have indicated that his primary care physician should reevaluate him and determine whether or not he has pneumonia or bronchitis as a source of the underlying cough and somnolence.

## 2017-10-26 NOTE — Telephone Encounter (Signed)
Spoke to wife.  Pt has been to urgent care Easter W/E had fever, slight cough,, no other signs if infection (labs and U/A negative. He was given ABX and continues with lethargy, confusion.  Has some incontinence now which is new.  Urine dark , drinking decreased. ? UTI , dehydration?  She is asking if this is from disease process of dementia.  I relayed would forward message to Dr. Anne Hahn.   No other changes in medications.  Dementia seems slightly worse.   Please advise.

## 2017-10-26 NOTE — Telephone Encounter (Addendum)
Pts wife called stating Easter Sunday pt went to the ED stating that they could not wake the pt up fully. Since then he has been exteremly tired. Kara Mead states she isn't sure what has been going on or if this is just natural progress of Alzheimer's.

## 2017-10-30 ENCOUNTER — Encounter: Payer: Self-pay | Admitting: Family Medicine

## 2017-10-30 ENCOUNTER — Ambulatory Visit (INDEPENDENT_AMBULATORY_CARE_PROVIDER_SITE_OTHER): Payer: Medicare Other | Admitting: Family Medicine

## 2017-10-30 DIAGNOSIS — R05 Cough: Secondary | ICD-10-CM | POA: Diagnosis not present

## 2017-10-30 DIAGNOSIS — R059 Cough, unspecified: Secondary | ICD-10-CM

## 2017-10-30 MED ORDER — BENZONATATE 200 MG PO CAPS: 200.0000 mg | ORAL_CAPSULE | Freq: Three times a day (TID) | ORAL | 1 refills | Status: DC | PRN

## 2017-10-30 NOTE — Progress Notes (Signed)
He was more confused and was less alert.  Speech was atypical.  This was around [facility] Sunday in April.  Seen at [facility].  Had a fever of 101 at that point.  Labs were unremarkable and CXR neg at [facility] Easter Sunday.  He had a cough, still does.  No more fevers.   Sleeping more than usual, less active in the meantime.  Urinary incontinence noted- some better in the last few days.  More agitation and confusion in the meantime, with this illness.    Had shingles shot 10/24/17, that was his second dose, per wife's report.    Meds, vitals, and allergies reviewed.   ROS: Per HPI unless specifically indicated in ROS section   GEN: nad, alert and pleasant in conversation but he does not recall all of the recent events. HEENT: mucous membranes moist NECK: supple w/o LA CV: rrr. PULM: ctab, no inc wob, occ cough noted.  ABD: soft, +bs EXT: no edema

## 2017-10-30 NOTE — Patient Instructions (Signed)
Use tessalon as needed and update me if not gradually better.  Likely the cough will get better in the next ~2 weeks.  Take care.  Glad to see you.

## 2017-10-30 NOTE — Assessment & Plan Note (Signed)
He has Alzheimer's dementia at baseline and likely some of his Alzheimer symptoms got worse acutely when he was concurrently ill.  The presumption is that he had a viral/self-limited process.  He had urinary incontinence and started with his acute illness but this has gotten some better in the meantime.  He appears to be overall improved from his nadir but not back to previous baseline.  He likely has a postinfectious cough, discussed.  Use tessalon as needed and update me if not gradually better.  Likely the cough will get better in the next ~2 weeks, but this can take up to 2 months to resolve.  All questions answered.  Okay for outpatient follow-up.  I appreciate the help of all involved.

## 2017-10-31 ENCOUNTER — Ambulatory Visit: Payer: Medicare Other | Admitting: Family Medicine

## 2017-11-01 ENCOUNTER — Encounter: Payer: Self-pay | Admitting: Family Medicine

## 2017-11-01 LAB — LAB REPORT - SCANNED
Creatinine, Ser: 0.9
Glucose: 95
Hemoglobin: 13.2
Platelets: 141
WBC: 7

## 2017-11-02 ENCOUNTER — Telehealth: Payer: Self-pay | Admitting: Family Medicine

## 2017-11-02 ENCOUNTER — Other Ambulatory Visit: Payer: Self-pay | Admitting: Family Medicine

## 2017-11-02 MED ORDER — ALBUTEROL SULFATE HFA 108 (90 BASE) MCG/ACT IN AERS: 1.0000 | INHALATION_SPRAY | Freq: Four times a day (QID) | RESPIRATORY_TRACT | 0 refills | Status: DC | PRN

## 2017-11-02 NOTE — Telephone Encounter (Signed)
Wife advised. 

## 2017-11-02 NOTE — Telephone Encounter (Signed)
I think that some of this from his recent sickness making his dementia sx worse (along with sleep disruption).  Reasonable to try albuterol for the cough. Dispense with spacer- rx sent.  Use spacer to make it easier for patient to inhale med.  Try that for the cough and if worse in the meantime then let me know.  Thanks.

## 2017-11-02 NOTE — Telephone Encounter (Signed)
I spoke with pt's wife; last night for 2 hours pt had some shortness of breath due to coughing episodes; pt has prod cough with clear to tan phlegm. No fever. Not really wheezing but can hear rattling in chest. Giving tessalon tid but not helping cough. Presently pt and Mrs Hidrogo are staying with their son in [place]. Any med need to be sent to [facility] [address]. Today pt is "out of it"; sleeping a lot and when he wakes up is combative; ? Due to alzheimers; Mrs Retzloff and son are able to handle pt at this time. Mrs Steck request cb after Dr Para March reviews note.

## 2017-11-02 NOTE — Telephone Encounter (Signed)
Copied from CRM [id number]. Topic: Quick Communication - See Telephone Encounter >> Nov 02, 2017 11:52 AM Raquel Sarna wrote: Pt's wife is call back to let Dr. Para March know of change in pt.  Pt is having a lot of coughing up of mucus.  Pt's wife isn't sure if he will need to be seen. Please call wife - Kara Mead back to discuss pt's care at [phone].

## 2017-12-11 ENCOUNTER — Other Ambulatory Visit: Payer: Self-pay | Admitting: Neurology

## 2017-12-18 ENCOUNTER — Other Ambulatory Visit: Payer: Self-pay | Admitting: Neurology

## 2017-12-18 DIAGNOSIS — J302 Other seasonal allergic rhinitis: Secondary | ICD-10-CM | POA: Diagnosis not present

## 2017-12-18 DIAGNOSIS — R05 Cough: Secondary | ICD-10-CM | POA: Diagnosis not present

## 2018-01-02 ENCOUNTER — Ambulatory Visit (INDEPENDENT_AMBULATORY_CARE_PROVIDER_SITE_OTHER): Payer: Medicare Other | Admitting: Adult Health

## 2018-01-02 ENCOUNTER — Other Ambulatory Visit: Payer: Self-pay

## 2018-01-02 ENCOUNTER — Encounter: Payer: Self-pay | Admitting: Adult Health

## 2018-01-02 VITALS — BP 116/73 | HR 86 | Wt 195.5 lb

## 2018-01-02 DIAGNOSIS — G309 Alzheimer's disease, unspecified: Secondary | ICD-10-CM

## 2018-01-02 DIAGNOSIS — F0281 Dementia in other diseases classified elsewhere with behavioral disturbance: Secondary | ICD-10-CM | POA: Diagnosis not present

## 2018-01-02 MED ORDER — RISPERIDONE 0.25 MG PO TABS: ORAL_TABLET | ORAL | 3 refills | Status: DC

## 2018-01-02 NOTE — Progress Notes (Signed)
PATIENT: Bryan ShellRobert Rauda DOB: 1945-07-15  REASON FOR VISIT: follow up HISTORY FROM: patient  HISTORY OF PRESENT ILLNESS: Today 01/02/18: Bryan Berry is a 72 year old male with a history of memory disturbance.  He returns today for follow-up.  He is currently on Aricept and Namenda and is tolerating the medication well.  His wife reports that he does require assistance with all ADLs.  He does not operate a motor vehicle.  His agitation has remained the same.  she continues to give him Risperdal twice a day.  She reports that at least 2-3 times a week he is up from midnight until 5 AM.  She typically gives him Risperdal at 8 AM and 8 PM.  They try to go to bed around 10 PM.  His wife manages all the finances.  She manages his medications.  At the last visit we discussed getting assistance at home however she has not pursued this at this time.  He returns today for evaluation.   HISTORY 07/03/17 Bryan Berry is a 72 year old male with a history of memory disturbance.  He returns today for follow-up.  He is currently on Aricept and Namenda and is tolerating most of the medications well.  His wife is with him today.  He reports that the agitation is slightly better with Risperdal.  She states that he is not as angry as he was when he before when agitated.  She states that he continues to get agitated and is not consolable during those times.  She states often they occur later in the evenings.  He sometimes will feel that he is not at his home.  She does state that when he does go to sleep he sleeps through the night.  He is able to complete most ADLs independently.  On occasion he needs some help with dressing.  His wife reports that she took over the finances 2 years ago.  She does all the cooking.  He is not currently operating a motor vehicle.  He returns today for an evaluation.  REVIEW OF SYSTEMS: Out of a complete 14 system review of symptoms, the patient complains only of the following symptoms, and  all other reviewed systems are negative.  See HPI  ALLERGIES: No Known Allergies  HOME MEDICATIONS: Outpatient Medications Prior to Visit  Medication Sig Dispense Refill  . albuterol (PROVENTIL HFA;VENTOLIN HFA) 108 (90 Base) MCG/ACT inhaler Inhale 1-2 puffs into the lungs every 6 (six) hours as needed (for cough). 1 Inhaler 0  . Ascorbic Acid (VITAMIN C) 1000 MG tablet Take 1,000 mg by mouth daily.    . benzonatate (TESSALON) 200 MG capsule Take 1 capsule (200 mg total) by mouth 3 (three) times daily as needed. 30 capsule 1  . Chlorpheniramine Maleate (ALLERGY RELIEF PO) Take 1 tablet by mouth daily as needed.    . Cholecalciferol (VITAMIN D-3) 1000 units CAPS Take by mouth.    . co-enzyme Q-10 30 MG capsule Take 30 mg by mouth daily.    Marland Kitchen. donepezil (ARICEPT) 10 MG tablet TAKE 1 TABLET BY MOUTH AT BEDTIME 90 tablet 3  . escitalopram (LEXAPRO) 10 MG tablet Take 1 tablet (10 mg total) by mouth daily. 90 tablet 3  . fexofenadine (ALLEGRA) 180 MG tablet Take 180 mg by mouth daily.    . Garlic 1000 MG CAPS Take by mouth.    Marland Kitchen. GLUCOSAMINE-CHONDROITIN PO Take 1 tablet by mouth 2 (two) times daily.    Marland Kitchen. L-Lysine 1000 MG TABS Take 500 mg  by mouth daily.     Marland Kitchen levocetirizine (XYZAL) 5 MG tablet daily.    . memantine (NAMENDA) 10 MG tablet Take 1 tablet (10 mg total) by mouth 2 (two) times daily. 180 tablet 3  . montelukast (SINGULAIR) 10 MG tablet Take 10 mg by mouth daily as needed.     . Multiple Vitamin (MULTIVITAMIN WITH MINERALS) TABS tablet Take 1 tablet by mouth daily.    . risperiDONE (RISPERDAL) 0.25 MG tablet TAKE 1 TABLET BY MOUTH TWICE DAILY 180 tablet 1  . Selenium 200 MCG CAPS Take 1 capsule by mouth daily.    . Turmeric, Curcuma Longa, (CURCUMIN) POWD by Does not apply route.    . vitamin B-12 (CYANOCOBALAMIN) 1000 MCG tablet Take 1,000 mcg by mouth daily.    . vitamin E 400 UNIT capsule Take 400 Units by mouth daily.    Marland Kitchen zinc gluconate 50 MG tablet Take 50 mg by mouth daily.       No facility-administered medications prior to visit.     PAST MEDICAL HISTORY: Past Medical History:  Diagnosis Date  . Allergy   . Memory loss   . UTI (lower urinary tract infection)    one episode    PAST SURGICAL HISTORY: Past Surgical History:  Procedure Laterality Date  . VARICOSE VEIN SURGERY      FAMILY HISTORY: Family History  Problem Relation Age of Onset  . Heart disease Father   . Colon cancer Neg Hx   . Prostate cancer Neg Hx     SOCIAL HISTORY: Social History   Socioeconomic History  . Marital status: Married    Spouse name: Not on file  . Number of children: 1  . Years of education: Masters   . Highest education level: Not on file  Occupational History  . Occupation: Retired  Engineer, production  . Financial resource strain: Not on file  . Food insecurity:    Worry: Not on file    Inability: Not on file  . Transportation needs:    Medical: Not on file    Non-medical: Not on file  Tobacco Use  . Smoking status: Former Games developer  . Smokeless tobacco: Never Used  . Tobacco comment: smoked a pipe, quit over 20 years ago  Substance and Sexual Activity  . Alcohol use: Yes    Alcohol/week: 0.0 oz    Comment: wine occassionally  . Drug use: No  . Sexual activity: Not on file  Lifestyle  . Physical activity:    Days per week: Not on file    Minutes per session: Not on file  . Stress: Not on file  Relationships  . Social connections:    Talks on phone: Not on file    Gets together: Not on file    Attends religious service: Not on file    Active member of club or organization: Not on file    Attends meetings of clubs or organizations: Not on file    Relationship status: Not on file  . Intimate partner violence:    Fear of current or ex partner: Not on file    Emotionally abused: Not on file    Physically abused: Not on file    Forced sexual activity: Not on file  Other Topics Concern  . 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   MMSE - Mini Mental State Exam 01/02/2018 07/03/2017 12/19/2016  Orientation to time 0 0 3  Orientation to Place 3 3 4   Registration 3 3 3   Attention/ Calculation 3 1 1   Recall 0 0 2  Language- name 2 objects 2 2 2   Language- repeat 1 1 1   Language- follow 3 step command 2 3 3   Language- read & follow direction 0 1 1  Write a sentence 1 1 1   Copy design 1 0 1  Total score 16 15 22     Generalized: Well developed, in no acute distress   Neurological examination  Mentation: Alert. Follows all commands speech and language fluent Cranial nerve II-XII: Pupils were equal round reactive to light. Extraocular movements were full, visual field were full on confrontational test. Facial sensation and strength were normal. Uvula tongue midline. Head turning and shoulder shrug  were normal and symmetric. Motor: The motor testing reveals 5 over 5 strength of all 4 extremities. Good symmetric motor tone is noted throughout.  Sensory: Sensory testing is intact to soft touch on all 4 extremities. No evidence of extinction is noted.  Coordination: Cerebellar testing reveals good finger-nose-finger and heel-to-shin bilaterally.  Gait and station: Gait is normal.  Reflexes: Deep tendon reflexes are symmetric and normal bilaterally.   DIAGNOSTIC DATA (LABS, IMAGING, TESTING) - I reviewed patient records, labs, notes, testing and imaging myself where available.  Lab Results  Component Value Date   WBC 7.0 10/13/2017   HGB 15.0 02/09/2017   HCT 45.1 02/09/2017   MCV 97.0 02/09/2017   PLT 141 10/13/2017      Component Value Date/Time   NA 142 02/09/2017 1716   K 4.9 02/09/2017 1716   CL 105 02/09/2017 1716   CO2 29 02/09/2017 1716    GLUCOSE 81 02/09/2017 1716   BUN 20 02/09/2017 1716   CREATININE 0.9 10/13/2017   CALCIUM 9.4 02/09/2017 1716   PROT 6.2 02/09/2017 1716   ALBUMIN 4.0 02/09/2017 1716   AST 20 02/09/2017 1716   ALT 14 02/09/2017 1716   ALKPHOS 76 02/09/2017 1716   BILITOT 0.7 02/09/2017 1716   GFRNONAA >60 10/23/2015 2330   GFRAA >60 10/23/2015 2330   Lab Results  Component Value Date   CHOL 201 (H) 02/09/2017   HDL 52.90 02/09/2017   LDLCALC 129 (H) 02/09/2017   TRIG 92.0 02/09/2017   CHOLHDL 4 02/09/2017   Lab Results  Component Value Date   VITAMINB12 484 10/23/2015   Lab Results  Component Value Date   TSH 2.838 10/23/2015      ASSESSMENT AND PLAN 72 y.o. year old male  has a past medical history of Allergy, Memory loss, and UTI (lower urinary tract infection). here with :  1.  Alzheimer's disease 2.  Sleep disturbance  The patient's memory score has remained stable.  The patient is having trouble sleeping.  I will increase Risperdal to 0.25 mg in the morning and 0.5 4.  Working on getting her she told me she is very and she is-year-old she is also a Psychologist, clinical and is reassured her mg at bedtime.  I did review with the patient and his wife that Risperdal has a black box warning in the setting of dementia.  They voiced understanding.  I have again encouraged the wife to consider additional help at home.  She voiced understanding.  He will follow-up in 6 months or sooner if needed.    Butch Penny, MSN, NP-C 01/02/2018, 11:42 AM [facility] [address], [address] [place], [place] [address] [phone]

## 2018-01-02 NOTE — Patient Instructions (Signed)
Your Plan:  Increase Risperdal to 1 tab in the AM and 2 tabs at bedtime Continue aricept and namenda Memory score is stable If your symptoms worsen or you develop new symptoms please let us know.    Thank you for coming to see us at [facility]. I hope we have been able to provide you high quality care today.  You may receive a patient satisfaction survey over the next few weeks. We would appreciate your feedback and comments so that we may continue to improve ourselves and the health of our patients.

## 2018-01-14 NOTE — Progress Notes (Signed)
I have read the note, and I agree with the clinical assessment and plan.  Charles K Willis   

## 2018-01-27 ENCOUNTER — Inpatient Hospital Stay
Admission: EM | Admit: 2018-01-27 | Discharge: 2018-01-28 | DRG: 177 | Disposition: A | Discharge status: Home / Self Care | Payer: Medicare Other | Attending: Internal Medicine | Admitting: Internal Medicine

## 2018-01-27 ENCOUNTER — Encounter: Payer: Self-pay | Admitting: Emergency Medicine

## 2018-01-27 ENCOUNTER — Emergency Department: Payer: Medicare Other

## 2018-01-27 ENCOUNTER — Other Ambulatory Visit: Payer: Self-pay

## 2018-01-27 DIAGNOSIS — Z66 Do not resuscitate: Secondary | ICD-10-CM | POA: Diagnosis present

## 2018-01-27 DIAGNOSIS — J690 Pneumonitis due to inhalation of food and vomit: Secondary | ICD-10-CM | POA: Diagnosis present

## 2018-01-27 DIAGNOSIS — G92 Toxic encephalopathy: Secondary | ICD-10-CM | POA: Diagnosis present

## 2018-01-27 DIAGNOSIS — R918 Other nonspecific abnormal finding of lung field: Secondary | ICD-10-CM | POA: Diagnosis not present

## 2018-01-27 DIAGNOSIS — F0390 Unspecified dementia without behavioral disturbance: Secondary | ICD-10-CM | POA: Diagnosis present

## 2018-01-27 DIAGNOSIS — Z87891 Personal history of nicotine dependence: Secondary | ICD-10-CM | POA: Diagnosis not present

## 2018-01-27 DIAGNOSIS — G9340 Encephalopathy, unspecified: Secondary | ICD-10-CM | POA: Diagnosis not present

## 2018-01-27 DIAGNOSIS — R262 Difficulty in walking, not elsewhere classified: Secondary | ICD-10-CM | POA: Diagnosis present

## 2018-01-27 DIAGNOSIS — J309 Allergic rhinitis, unspecified: Secondary | ICD-10-CM | POA: Diagnosis present

## 2018-01-27 DIAGNOSIS — Z79899 Other long term (current) drug therapy: Secondary | ICD-10-CM

## 2018-01-27 DIAGNOSIS — R4182 Altered mental status, unspecified: Secondary | ICD-10-CM | POA: Diagnosis not present

## 2018-01-27 DIAGNOSIS — R05 Cough: Secondary | ICD-10-CM

## 2018-01-27 DIAGNOSIS — R059 Cough, unspecified: Secondary | ICD-10-CM

## 2018-01-27 HISTORY — DX: Unspecified dementia, unspecified severity, without behavioral disturbance, psychotic disturbance, mood disturbance, and anxiety: F03.90

## 2018-01-27 LAB — CBC WITH DIFFERENTIAL/PLATELET
Basophils Absolute: 0.1 10*3/uL (ref 0–0.1)
Basophils Relative: 1 %
EOS ABS: 0.3 10*3/uL (ref 0–0.7)
Eosinophils Relative: 2 %
HCT: 40.9 % (ref 40.0–52.0)
HEMOGLOBIN: 14.2 g/dL (ref 13.0–18.0)
LYMPHS ABS: 1 10*3/uL (ref 1.0–3.6)
LYMPHS PCT: 8 %
MCH: 32.9 pg (ref 26.0–34.0)
MCHC: 34.7 g/dL (ref 32.0–36.0)
MCV: 94.7 fL (ref 80.0–100.0)
MONOS PCT: 7 %
Monocytes Absolute: 1 10*3/uL (ref 0.2–1.0)
Neutro Abs: 11.2 10*3/uL — ABNORMAL HIGH (ref 1.4–6.5)
Neutrophils Relative %: 82 %
Platelets: 174 10*3/uL (ref 150–440)
RBC: 4.32 MIL/uL — AB (ref 4.40–5.90)
RDW: 13.8 % (ref 11.5–14.5)
WBC: 13.6 10*3/uL — ABNORMAL HIGH (ref 3.8–10.6)

## 2018-01-27 LAB — COMPREHENSIVE METABOLIC PANEL
ALT: 10 U/L (ref 0–44)
AST: 18 U/L (ref 15–41)
Albumin: 3.7 g/dL (ref 3.5–5.0)
Alkaline Phosphatase: 81 U/L (ref 38–126)
Anion gap: 6 (ref 5–15)
BUN: 22 mg/dL (ref 8–23)
CHLORIDE: 106 mmol/L (ref 98–111)
CO2: 27 mmol/L (ref 22–32)
CREATININE: 1.07 mg/dL (ref 0.61–1.24)
Calcium: 8.9 mg/dL (ref 8.9–10.3)
GFR calc Af Amer: >60 mL/min (ref >60)
GFR calc non Af Amer: >60 mL/min (ref >60)
Glucose, Bld: 103 mg/dL — ABNORMAL HIGH (ref 70–99)
POTASSIUM: 3.7 mmol/L (ref 3.5–5.1)
SODIUM: 139 mmol/L (ref 135–145)
Total Bilirubin: 1.5 mg/dL — ABNORMAL HIGH (ref 0.3–1.2)
Total Protein: 6.6 g/dL (ref 6.5–8.1)

## 2018-01-27 LAB — URINALYSIS, COMPLETE (UACMP) WITH MICROSCOPIC
BACTERIA UA: NONE SEEN
Bilirubin Urine: NEGATIVE
Glucose, UA: NEGATIVE mg/dL
Ketones, ur: NEGATIVE mg/dL
Leukocytes, UA: NEGATIVE
Nitrite: NEGATIVE
PROTEIN: NEGATIVE mg/dL
Specific Gravity, Urine: 1.023 (ref 1.005–1.030)
pH: 5 (ref 5.0–8.0)

## 2018-01-27 LAB — AMMONIA: AMMONIA: 16 umol/L (ref 9–35)

## 2018-01-27 LAB — LACTIC ACID, PLASMA: LACTIC ACID, VENOUS: 0.9 mmol/L (ref 0.5–1.9)

## 2018-01-27 LAB — TSH: TSH: 1.951 u[IU]/mL (ref 0.350–4.500)

## 2018-01-27 MED ORDER — IPRATROPIUM-ALBUTEROL 0.5-2.5 (3) MG/3ML IN SOLN: 3.0000 mL | RESPIRATORY_TRACT | Status: DC | PRN

## 2018-01-27 MED ORDER — SODIUM CHLORIDE 0.9 % IV BOLUS
500.0000 mL | Freq: Once | INTRAVENOUS | Status: AC
  Administered 2018-01-27: 500 mL via INTRAVENOUS

## 2018-01-27 MED ORDER — SODIUM CHLORIDE 0.9% FLUSH: 3.0000 mL | Freq: Two times a day (BID) | INTRAVENOUS | Status: DC

## 2018-01-27 MED ORDER — PIPERACILLIN-TAZOBACTAM 3.375 G IVPB 30 MIN: 3.3750 g | Freq: Four times a day (QID) | INTRAVENOUS | Status: DC

## 2018-01-27 MED ORDER — SODIUM CHLORIDE 0.9 % IV SOLN: 250.0000 mL | INTRAVENOUS | Status: DC | PRN

## 2018-01-27 MED ORDER — ENOXAPARIN SODIUM 40 MG/0.4ML ~~LOC~~ SOLN
40.0000 mg | SUBCUTANEOUS | Status: DC
  Administered 2018-01-27: 40 mg via SUBCUTANEOUS
  Filled 2018-01-27: qty 0.4

## 2018-01-27 MED ORDER — LORATADINE 10 MG PO TABS
10.0000 mg | ORAL_TABLET | Freq: Every day | ORAL | Status: DC
  Filled 2018-01-27: qty 1

## 2018-01-27 MED ORDER — PIPERACILLIN-TAZOBACTAM 3.375 G IVPB
3.3750 g | Freq: Three times a day (TID) | INTRAVENOUS | Status: DC
  Administered 2018-01-27 – 2018-01-28 (×2): 3.375 g via INTRAVENOUS
  Filled 2018-01-27 (×2): qty 50

## 2018-01-27 MED ORDER — BENZONATATE 100 MG PO CAPS: 200.0000 mg | ORAL_CAPSULE | ORAL | Status: DC | PRN

## 2018-01-27 MED ORDER — SODIUM CHLORIDE 0.9% FLUSH: 3.0000 mL | INTRAVENOUS | Status: DC | PRN

## 2018-01-27 MED ORDER — SODIUM CHLORIDE 0.9 % IV SOLN
INTRAVENOUS | Status: AC
  Administered 2018-01-27: 18:00:00 via INTRAVENOUS
  Filled 2018-01-27 (×2): qty 1000

## 2018-01-27 MED ORDER — SODIUM CHLORIDE 0.9 % IV SOLN
3.0000 g | Freq: Four times a day (QID) | INTRAVENOUS | Status: DC
  Administered 2018-01-27: 3 g via INTRAVENOUS
  Filled 2018-01-27 (×4): qty 3

## 2018-01-27 MED ORDER — GUAIFENESIN ER 600 MG PO TB12
600.0000 mg | ORAL_TABLET | Freq: Two times a day (BID) | ORAL | Status: DC
  Administered 2018-01-27 – 2018-01-28 (×2): 600 mg via ORAL
  Filled 2018-01-27 (×2): qty 1

## 2018-01-27 NOTE — ED Provider Notes (Signed)
[facility] Emergency Department Provider Note   ____________________________________________   First MD Initiated Contact with Patient 01/27/18 1319     (approximate)  I have reviewed the triage vital signs and the nursing notes.   HISTORY  Chief Complaint Cough and Altered Mental Status  EM caveat: Patient with confusion, history provided is primarily from his wife  HPI Bryan Berry is a 72 y.o. male history of some memory loss dementia, occasional UTIs.  Wife reports since about Thursday he is just been acting confused, not able to walk on his own.  He is normally able to walk on his own to be having to assist him quite a bit yesterday and today he has not been able to walk.  She eat a little bit yesterday, but reports is not really eating.  He just does not seem with it.  She has not noticed any weakness in his face or trouble speaking or weakness on one side, but he just has seemed very confused, tired, she reports she does not acting right.  She has noticed also has been coughing a fair amount with a productive cough.  Does not look particularly short of breath.  He is not complaining of any pain.  Patient reports himself at he is not having any pain or discomfort.  He has not noticed any trouble breathing.  No abdominal pain.  Patient does not recall why he is here.  Denies headache or neck pain.      Past Medical History:  Diagnosis Date  . Allergy   . Memory loss   . UTI (lower urinary tract infection)    one episode    Patient Active Problem List   Diagnosis Date Noted  . Cough 10/30/2017  . Medicare annual wellness visit, initial 02/10/2017  . Advance care planning 02/10/2017  . Alzheimer's disease 05/28/2015  . Snoring 05/28/2015    Past Surgical History:  Procedure Laterality Date  . VARICOSE VEIN SURGERY      Prior to Admission medications   Medication Sig Start Date End Date Taking? Authorizing Provider  albuterol  (PROVENTIL HFA;VENTOLIN HFA) 108 (90 Base) MCG/ACT inhaler Inhale 1-2 puffs into the lungs every 6 (six) hours as needed (for cough). 11/02/17   Joaquim Nam, MD  Ascorbic Acid (VITAMIN C) 1000 MG tablet Take 1,000 mg by mouth daily.    [provider]  benzonatate (TESSALON) 200 MG capsule Take 1 capsule (200 mg total) by mouth 3 (three) times daily as needed. 10/30/17   Joaquim Nam, MD  Chlorpheniramine Maleate (ALLERGY RELIEF PO) Take 1 tablet by mouth daily as needed.    [provider]  Cholecalciferol (VITAMIN D-3) 1000 units CAPS Take by mouth.    [provider]  co-enzyme Q-10 30 MG capsule Take 30 mg by mouth daily.    [provider]  donepezil (ARICEPT) 10 MG tablet TAKE 1 TABLET BY MOUTH AT BEDTIME 12/18/17   York Spaniel, MD  escitalopram (LEXAPRO) 10 MG tablet Take 1 tablet (10 mg total) by mouth daily. 07/03/17   Butch Penny, NP  fexofenadine (ALLEGRA) 180 MG tablet Take 180 mg by mouth daily.    [provider]  Garlic 1000 MG CAPS Take by mouth.    [provider]  GLUCOSAMINE-CHONDROITIN PO Take 1 tablet by mouth 2 (two) times daily.    [provider]  L-Lysine 1000 MG TABS Take 500 mg by mouth daily.     [provider]  levocetirizine (XYZAL) 5 MG tablet daily. 05/22/17   [provider]  memantine (NAMENDA) 10 MG tablet Take 1 tablet (10 mg total) by mouth 2 (two) times daily. 07/03/17   Butch Penny, NP  montelukast (SINGULAIR) 10 MG tablet Take 10 mg by mouth daily as needed.     [provider]  Multiple Vitamin (MULTIVITAMIN WITH MINERALS) TABS tablet Take 1 tablet by mouth daily.    [provider]  risperiDONE (RISPERDAL) 0.25 MG tablet Take 1 tab PO in the AM and 2 tabs at bedtime 01/02/18   Butch Penny, NP  Selenium 200 MCG CAPS Take 1 capsule by mouth daily.    [provider]  Turmeric, Corwin Levins, (CURCUMIN) POWD by Does not apply route.     [provider]  vitamin B-12 (CYANOCOBALAMIN) 1000 MCG tablet Take 1,000 mcg by mouth daily.    [provider]  vitamin E 400 UNIT capsule Take 400 Units by mouth daily.    [provider]  zinc gluconate 50 MG tablet Take 50 mg by mouth daily.    [provider]    Allergies Patient has no known allergies.  Family History  Problem Relation Age of Onset  . Heart disease Father   . Colon cancer Neg Hx   . Prostate cancer Neg Hx     Social History Social History   Tobacco Use  . Smoking status: Former Games developer  . Smokeless tobacco: Never Used  . Tobacco comment: smoked a pipe, quit over 20 years ago  Substance Use Topics  . Alcohol use: Yes    Alcohol/week: 0.0 oz    Comment: wine occassionally  . Drug use: No    Review of Systems EM caveat Constitutional: No fever/chills Eyes: No visual changes. ENT: No sore throat. Cardiovascular: Denies chest pain. Respiratory: Denies shortness of breath.  Cough. Gastrointestinal: No abdominal pain.  Hardly eating anything the last 2 days.  No nausea, no vomiting.  No diarrhea.   Genitourinary: Unknown Musculoskeletal: Negative for back pain. Skin: Negative for rash. Neurological: Negative for headaches.    ____________________________________________   PHYSICAL EXAM:  VITAL SIGNS: ED Triage Vitals  Enc Vitals Group     BP 01/27/18 1255 104/61     Pulse Rate 01/27/18 1255 90     Resp 01/27/18 1255 20     Temp 01/27/18 1255 98.3 F (36.8 C)     Temp Source 01/27/18 1255 Oral     SpO2 01/27/18 1255 91 %     Weight 01/27/18 1257 195 lb (88.5 kg)     Height 01/27/18 1257 5\' 8"  (1.727 m)     Head Circumference --      Peak Flow --      Pain Score --      Pain Loc --      Pain Edu? --      Excl. in GC? --     Constitutional: Alert and oriented to himself but not to place or time.  Recognizes his wife.  He appears generally ill but in no distress.  Rather placid, sitting up but a  little bit somnolent.  Generally just not well appearing, does look ill. Eyes: Conjunctivae are slightly injected bilateral, there is some very slight matting around the eyelids.  No frank purulence. Head: Atraumatic. Nose: No congestion/rhinnorhea. Mouth/Throat: Mucous membranes are dry. Neck: No stridor.  No JVD. Cardiovascular: Normal rate, regular rhythm. Grossly normal heart sounds.  Good peripheral circulation. Respiratory: Normal  respiratory effort.  No retractions.  Right lower lung fields demonstrate crackles.  Right upper and left-sided fields are clear.  No wheezing.  Oxygen saturation about 92% on room air, 98% on 2 L. Gastrointestinal: Soft and nontender. No distention. Musculoskeletal: No lower extremity tenderness nor edema. Neurologic: Pleasant but confused.  Calm, somewhat flattened affect.  Equal smile.  Equal grip strength in both hands.  Able to use his toes to wiggle but very weak in his legs bilaterally, about 4 to 3 out of 5 strength lower extremities bilateral.  No focal deficits are noted.  Patient is somewhat slow in response to questions, and difficulty following more than simple commands. Skin:  Skin is cool, dry and intact. No rash noted. Psychiatric: Mood and affect are calm.  Possibly described as "pleasantly confused"  ____________________________________________   LABS (all labs ordered are listed, but only abnormal results are displayed)  Labs Reviewed  COMPREHENSIVE METABOLIC PANEL - Abnormal; Notable for the following components:      Result Value   Glucose, Bld 103 (*)    Total Bilirubin 1.5 (*)    All other components within normal limits  CBC WITH DIFFERENTIAL/PLATELET - Abnormal; Notable for the following components:   WBC 13.6 (*)    RBC 4.32 (*)    Neutro Abs 11.2 (*)    All other components within normal limits  URINALYSIS, COMPLETE (UACMP) WITH MICROSCOPIC - Abnormal; Notable for the following components:   Color, Urine YELLOW (*)     APPearance CLEAR (*)    Hgb urine dipstick SMALL (*)    All other components within normal limits  CULTURE, BLOOD (ROUTINE X 2)  CULTURE, BLOOD (ROUTINE X 2)  LACTIC ACID, PLASMA  TSH  AMMONIA  LACTIC ACID, PLASMA  CBG MONITORING, ED   ____________________________________________  EKG  Reviewed enterotomy at 1500 Heart rate 60 QRS 100 QTc 440 Normal sinus rhythm, no evidence of ischemia. ____________________________________________  RADIOLOGY  Ct Head Wo Contrast  Result Date: 01/27/2018 CLINICAL DATA:  Altered mental status. Cough and increased confusion since Thursday. Not feeling well. EXAM: CT HEAD WITHOUT CONTRAST TECHNIQUE: Contiguous axial images were obtained from the base of the skull through the vertex without intravenous contrast. COMPARISON:  10/23/2015 FINDINGS: Brain: There is moderate central and cortical atrophy. There is no intra or extra-axial fluid collection or mass lesion. The basilar cisterns and ventricles have a normal appearance. There is no CT evidence for acute infarction or hemorrhage. Vascular: There is atherosclerotic calcification of the internal carotid arteries. Skull: Normal. Negative for fracture or focal lesion. Sinuses/Orbits: No acute finding. Other: None. IMPRESSION: 1. Atrophy. 2.  No evidence for acute intracranial abnormality. Electronically Signed   By: Norva Pavlov M.D.   On: 01/27/2018 14:06   Ct Chest Wo Contrast  Result Date: 01/27/2018 CLINICAL DATA:  Cough with increased confusion and malaise for 2 days. Evaluate for pneumonia. EXAM: CT CHEST WITHOUT CONTRAST TECHNIQUE: Multidetector CT imaging of the chest was performed following the standard protocol without IV contrast. COMPARISON:  Chest radiographs 01/27/2018 and 10/24/2015. FINDINGS: Cardiovascular: Mild atherosclerosis of the aorta, great vessels and coronary arteries. No acute vascular findings on noncontrast imaging. The heart size is normal. There is no pericardial effusion.  Mediastinum/Nodes: There are no enlarged mediastinal, hilar or axillary lymph nodes.There is an indeterminate density right thyroid nodule measuring 2.5 x 2.0 cm on image 30/2. There is mild tracheal deviation to the right by vascular structures. The esophagus appears normal. Lungs/Pleura: No significant pleural effusion.  There are dependent airspace opacities at both lung bases, left greater than right. There is associated central airway thickening, and the left lower lobe bronchus appears narrowed. No confluent airspace opacity or suspicious pulmonary nodule. Upper abdomen: No acute findings are seen within the visualized upper abdomen. Musculoskeletal/Chest wall: There is no chest wall mass or suspicious osseous finding. Degenerative changes are present throughout the spine. IMPRESSION: 1. Dependent airspace opacities at both lung bases, left greater than right. These are non-specific and may reflect atelectasis, although early aspiration is a possibility. Radiographic follow up recommended. 2. Mild Aortic Atherosclerosis (ICD10-I70.0). 3. Indeterminate right thyroid nodule. If further evaluation indicated clinically, this would be best evaluated by thyroid ultrasound. Electronically Signed   By: Carey Bullocks M.D.   On: 01/27/2018 14:12   Dg Chest Port 1 View  Result Date: 01/27/2018 CLINICAL DATA:  Cough and congestion EXAM: PORTABLE CHEST 1 VIEW COMPARISON:  10/24/2015 FINDINGS: The heart size and mediastinal contours are within normal limits. Both lungs are well aerated. Mild atelectatic changes are noted in the left mid lung. The visualized skeletal structures are unremarkable. IMPRESSION: Mild left midlung atelectatic changes Electronically Signed   By: Alcide Clever M.D.   On: 01/27/2018 13:34    ____________________________________________   PROCEDURES  Procedure(s) performed: None  Procedures  Critical Care performed: No  ____________________________________________   INITIAL  IMPRESSION / ASSESSMENT AND PLAN / ED COURSE  Pertinent labs & imaging results that were available during my care of the patient were reviewed by me and considered in my medical decision making (see chart for details).  's indolent course of worsening confusion, weakness with an associated cough.  Patient does have what appears to be bilateral conjunctivitis, likely viral based on the evaluation presentation.  I suspect the patient is likely suffering from some dehydration with slightly low blood pressure and barely eating, in addition likely sources some type of an infectious etiology.  Suspicious for pulmonary etiology including bronchitis, pneumonia.  He does have a low oxygen saturation but does not exhibit a clear hypoxia on room air.  He does correct with 2 L.  He appears dehydrated, somewhat cool with his skin.  We will hydrate here, check labs including CBC metabolic panel, chest x-ray.  In addition to his confusion we will add a TSH and ammonia.  Await lactic acid.  Also will obtain CT of the head given the confusion evaluate for any acute abnormality such as hemorrhage or small infarct, additionally will order CT of the chest to further evaluate for pulmonary disease as based on my clinical exam it seems like he may have right lower lobe pneumonia that is just not radiographically evident by CXR.  ----------------------------------------- 2:39 PM on 01/27/2018 -----------------------------------------  On chest x-ray CT is reviewed, CT does demonstrate what appears to be bilateral lower airspace disease and given his rales on exam, newly productive cough and altered mental status I suspect this may represent aspiration pneumonia.  Will start patient empirically on Unasyn, blood cultures ordered.  Discussed with the patient's wife and as he is now nonambulatory, progressively worsening confusion over the last 2 days and does not appear that he can be safely cared for at home and with his heart  rate of 90, respirations of 20, and his elevated white count I suspect the patient is developing toward possible sepsis.  Discussed case with Dr. Katheren Shams of hospitalist service, will evaluate for admission and treatment      ____________________________________________   FINAL CLINICAL IMPRESSION(S) /  ED DIAGNOSES  Final diagnoses:  Aspiration pneumonia of both lower lobes, unspecified aspiration pneumonia type (HCC)  Altered mental status, unspecified altered mental status type      NEW MEDICATIONS STARTED DURING THIS VISIT:  New Prescriptions   No medications on file     Note:  This document was prepared using Dragon voice recognition software and may include unintentional dictation errors.     Sharyn CreamerQuale, Mark, MD 01/27/18 [phone]

## 2018-01-27 NOTE — H&P (Signed)
[facility] [place] [facility]   PATIENT NAME: Bryan Berry    MR#:  [id number]  DATE OF BIRTH:  18-Sep-1945  DATE OF ADMISSION:  01/27/2018  PRIMARY CARE PHYSICIAN: Joaquim Nam, MD   REQUESTING/REFERRING PHYSICIAN:   CHIEF COMPLAINT:   Chief Complaint  Patient presents with  . Cough  . Altered Mental Status    HISTORY OF PRESENT ILLNESS: Bryan Berry  is a 72 y.o. male with a known history per below presenting to the emergency room with 2-day history of worsening productive cough, increasing confusion, decrease in activities of daily living, lethargy, in the emergency room work-up noted for bilateral pneumonia suspicious for aspiration, white count 13,000 total bili 1.5, ammonia levels normal, patient evaluated emergency room, wife/son at the bedside, patient in no apparent distress, resting comfortably in bed, patient is poor historian due to dementia, patient now be admitted for acute aspiration pneumonia with acute encephalopathy compounded by dementia.  PAST MEDICAL HISTORY:   Past Medical History:  Diagnosis Date  . Allergy   . Memory loss   . UTI (lower urinary tract infection)    one episode    PAST SURGICAL HISTORY:  Past Surgical History:  Procedure Laterality Date  . CATARACT EXTRACTION W/ INTRAOCULAR LENS  IMPLANT, BILATERAL     feb and march 2019  . VARICOSE VEIN SURGERY      SOCIAL HISTORY:  Social History   Tobacco Use  . Smoking status: Former Games developer  . Smokeless tobacco: Never Used  . Tobacco comment: smoked a pipe, quit over 20 years ago  Substance Use Topics  . Alcohol use: Yes    Alcohol/week: 0.0 oz    Comment: wine occassionally    FAMILY HISTORY:  Family History  Problem Relation Age of Onset  . Heart disease Father   . Colon cancer Neg Hx   . Prostate cancer Neg Hx     DRUG ALLERGIES: No Known Allergies  REVIEW OF SYSTEMS: Unable to be obtained given dementia, family at the bedside  CONSTITUTIONAL: No  fever, fatigue or weakness.  EYES: No blurred or double vision.  EARS, NOSE, AND THROAT: No tinnitus or ear pain.  RESPIRATORY: No cough, shortness of breath, wheezing or hemoptysis.  CARDIOVASCULAR: No chest pain, orthopnea, edema.  GASTROINTESTINAL: No nausea, vomiting, diarrhea or abdominal pain.  GENITOURINARY: No dysuria, hematuria.  ENDOCRINE: No polyuria, nocturia,  HEMATOLOGY: No anemia, easy bruising or bleeding SKIN: No rash or lesion. MUSCULOSKELETAL: No joint pain or arthritis.   NEUROLOGIC: No tingling, numbness, weakness.  PSYCHIATRY: No anxiety or depression.   MEDICATIONS AT HOME:  Prior to Admission medications   Medication Sig Start Date End Date Taking? Authorizing Provider  benzonatate (TESSALON) 200 MG capsule Take 1 capsule (200 mg total) by mouth 3 (three) times daily as needed. 10/30/17  Yes Joaquim Nam, MD  donepezil (ARICEPT) 10 MG tablet TAKE 1 TABLET BY MOUTH AT BEDTIME Patient taking differently: TAKE 1 TABLET BY MOUTH DAILY IN THE MORNING 12/18/17  Yes York Spaniel, MD  escitalopram (LEXAPRO) 10 MG tablet Take 1 tablet (10 mg total) by mouth daily. Patient taking differently: Take 10 mg by mouth at bedtime.  07/03/17  Yes Butch Penny, NP  fexofenadine (ALLEGRA) 180 MG tablet Take 180 mg by mouth daily as needed for allergies.    Yes [provider]  memantine (NAMENDA) 10 MG tablet Take 1 tablet (10 mg total) by mouth 2 (two) times daily. 07/03/17  Yes Millikan,  Megan, NP  risperiDONE (RISPERDAL) 0.25 MG tablet Take 1 tab PO in the AM and 2 tabs at bedtime Patient taking differently: Take 0.25 mg by mouth 2 (two) times daily.  01/02/18  Yes Butch PennyMillikan, Megan, NP      PHYSICAL EXAMINATION:   VITAL SIGNS: Blood pressure 122/79, pulse (!) 54, temperature 98.3 F (36.8 C), temperature source Oral, resp. rate 12, height 5\' 8"  (1.727 m), weight 88.5 kg (195 lb), SpO2 94 %.  GENERAL:  72 y.o.-year-old patient lying in the bed with no acute  distress.  Frail-appearing EYES: Pupils equal, round, reactive to light and accommodation. No scleral icterus. Extraocular muscles intact.  HEENT: Head atraumatic, normocephalic. Oropharynx and nasopharynx clear.  NECK:  Supple, no jugular venous distention. No thyroid enlargement, no tenderness.  LUNGS: Coarse breath sounds bilaterally. No use of accessory muscles of respiration.  CARDIOVASCULAR: S1, S2 normal. No murmurs, rubs, or gallops.  ABDOMEN: Soft, nontender, nondistended. Bowel sounds present. No organomegaly or mass.  EXTREMITIES: No pedal edema, cyanosis, or clubbing.  NEUROLOGIC: Cranial nerves II through XII are intact. MAES. Gait not checked.  PSYCHIATRIC: The patient is lethargic but arousable acute aspiration pneumonia SKIN: No obvious rash, lesion, or ulcer.   LABORATORY PANEL:   CBC Recent Labs  Lab 01/27/18 1304  WBC 13.6*  HGB 14.2  HCT 40.9  PLT 174  MCV 94.7  MCH 32.9  MCHC 34.7  RDW 13.8  LYMPHSABS 1.0  MONOABS 1.0  EOSABS 0.3  BASOSABS 0.1   ------------------------------------------------------------------------------------------------------------------  Chemistries  Recent Labs  Lab 01/27/18 1304  NA 139  K 3.7  CL 106  CO2 27  GLUCOSE 103*  BUN 22  CREATININE 1.07  CALCIUM 8.9  AST 18  ALT 10  ALKPHOS 81  BILITOT 1.5*   ------------------------------------------------------------------------------------------------------------------ estimated creatinine clearance is 67.4 mL/min (by C-G formula based on SCr of 1.07 mg/dL). ------------------------------------------------------------------------------------------------------------------ Recent Labs    01/27/18 1304  TSH 1.951     Coagulation profile No results for input(s): INR, PROTIME in the last 168 hours. ------------------------------------------------------------------------------------------------------------------- No results for input(s): DDIMER in the last 72  hours. -------------------------------------------------------------------------------------------------------------------  Cardiac Enzymes No results for input(s): CKMB, TROPONINI, MYOGLOBIN in the last 168 hours.  Invalid input(s): CK ------------------------------------------------------------------------------------------------------------------ Invalid input(s): POCBNP  ---------------------------------------------------------------------------------------------------------------  Urinalysis    Component Value Date/Time   COLORURINE YELLOW (A) 01/27/2018 1304   APPEARANCEUR CLEAR (A) 01/27/2018 1304   LABSPEC 1.023 01/27/2018 1304   PHURINE 5.0 01/27/2018 1304   GLUCOSEU NEGATIVE 01/27/2018 1304   HGBUR SMALL (A) 01/27/2018 1304   BILIRUBINUR NEGATIVE 01/27/2018 1304   KETONESUR NEGATIVE 01/27/2018 1304   PROTEINUR NEGATIVE 01/27/2018 1304   NITRITE NEGATIVE 01/27/2018 1304   LEUKOCYTESUR NEGATIVE 01/27/2018 1304     RADIOLOGY: Ct Head Wo Contrast  Result Date: 01/27/2018 CLINICAL DATA:  Altered mental status. Cough and increased confusion since Thursday. Not feeling well. EXAM: CT HEAD WITHOUT CONTRAST TECHNIQUE: Contiguous axial images were obtained from the base of the skull through the vertex without intravenous contrast. COMPARISON:  10/23/2015 FINDINGS: Brain: There is moderate central and cortical atrophy. There is no intra or extra-axial fluid collection or mass lesion. The basilar cisterns and ventricles have a normal appearance. There is no CT evidence for acute infarction or hemorrhage. Vascular: There is atherosclerotic calcification of the internal carotid arteries. Skull: Normal. Negative for fracture or focal lesion. Sinuses/Orbits: No acute finding. Other: None. IMPRESSION: 1. Atrophy. 2.  No evidence for acute intracranial abnormality. Electronically Signed   By: Lanora ManisElizabeth  Manson Passey M.D.   On: 01/27/2018 14:06   Ct Chest Wo Contrast  Result Date:  01/27/2018 CLINICAL DATA:  Cough with increased confusion and malaise for 2 days. Evaluate for pneumonia. EXAM: CT CHEST WITHOUT CONTRAST TECHNIQUE: Multidetector CT imaging of the chest was performed following the standard protocol without IV contrast. COMPARISON:  Chest radiographs 01/27/2018 and 10/24/2015. FINDINGS: Cardiovascular: Mild atherosclerosis of the aorta, great vessels and coronary arteries. No acute vascular findings on noncontrast imaging. The heart size is normal. There is no pericardial effusion. Mediastinum/Nodes: There are no enlarged mediastinal, hilar or axillary lymph nodes.There is an indeterminate density right thyroid nodule measuring 2.5 x 2.0 cm on image 30/2. There is mild tracheal deviation to the right by vascular structures. The esophagus appears normal. Lungs/Pleura: No significant pleural effusion. There are dependent airspace opacities at both lung bases, left greater than right. There is associated central airway thickening, and the left lower lobe bronchus appears narrowed. No confluent airspace opacity or suspicious pulmonary nodule. Upper abdomen: No acute findings are seen within the visualized upper abdomen. Musculoskeletal/Chest wall: There is no chest wall mass or suspicious osseous finding. Degenerative changes are present throughout the spine. IMPRESSION: 1. Dependent airspace opacities at both lung bases, left greater than right. These are non-specific and may reflect atelectasis, although early aspiration is a possibility. Radiographic follow up recommended. 2. Mild Aortic Atherosclerosis (ICD10-I70.0). 3. Indeterminate right thyroid nodule. If further evaluation indicated clinically, this would be best evaluated by thyroid ultrasound. Electronically Signed   By: Carey Bullocks M.D.   On: 01/27/2018 14:12   Dg Chest Port 1 View  Result Date: 01/27/2018 CLINICAL DATA:  Cough and congestion EXAM: PORTABLE CHEST 1 VIEW COMPARISON:  10/24/2015 FINDINGS: The heart size  and mediastinal contours are within normal limits. Both lungs are well aerated. Mild atelectatic changes are noted in the left mid lung. The visualized skeletal structures are unremarkable. IMPRESSION: Mild left midlung atelectatic changes Electronically Signed   By: Alcide Clever M.D.   On: 01/27/2018 13:34    EKG: Orders placed or performed during the hospital encounter of 01/27/18  . ED EKG  . ED EKG    IMPRESSION AND PLAN: *Acute aspiration pneumonia, bilateral Suspect due to worsening dementia Admit to regular nursing for bed on our pneumonia protocol, empiric Zosyn, follow-up on cultures breathing treatments as needed,  *Acute toxic metabolic encephalopathy Compounded by dementia Secondary to above Hold psychotropic meds, avoid sedating agents, neurochecks per routine, aspiration/fall precautions, increase nursing care PRN, IV fluids for rehydration, supplemental oxygen wean as tolerated, speech therapy to see in the morning  *Chronic dementia Worsening mental status due to above Plan of care as stated above  *Chronic allergic rhinitis Stable Continue home regiment   All the records are reviewed and case discussed with ED provider. Management plans discussed with the patient, family and they are in agreement.  CODE STATUS:dnr    TOTAL TIME TAKING CARE OF THIS PATIENT: 45 minutes.    Evelena Asa Aury Scollard M.D on 01/27/2018   Between 7am to 6pm - Pager - [phone]  After 6pm go to [email or web address] - password [email or web address] [facility]  [facility]  Office  [phone]  CC: Primary care physician; Joaquim Nam, MD   Note: This dictation was prepared with Dragon dictation along with smaller phrase technology. Any transcriptional errors that result from this process are unintentional.

## 2018-01-27 NOTE — ED Notes (Signed)
Wife at bedside speaking with Dr. Fanny BienQuale, states since Thursday pt has had cough and increased confusion and just overall not feeling well. Wife states today he has been worse with his confusion and not able to answer questions, wanting to eat anything and not able to move.  Pt is able to tell us his name.  Wife denies any fevers.

## 2018-01-27 NOTE — ED Triage Notes (Signed)
Cough x 2 days, history of alzheimers with increased confusion.

## 2018-01-27 NOTE — Progress Notes (Signed)
Pharmacy Antibiotic Note  Bryan Berry is a [age] y.Bryan Berry. male admitted on 01/27/2018 with pneumonia.  Pharmacy has been consulted for Unasyn dosing.  Plan: Unasyn 3 gm IV Q6H  Height: 5\' 8"  (172.7 cm) Weight: 195 lb (88.5 kg) IBW/kg (Calculated) : 68.4  Temp (24hrs), Avg:98.3 F (36.8 C), Min:98.3 F (36.8 C), Max:98.3 F (36.8 C)  Recent Labs  Lab 01/27/18 1304  WBC 13.6*  CREATININE 1.07  LATICACIDVEN 0.9    Estimated Creatinine Clearance: 67.4 mL/min (by C-G formula based on SCr of 1.07 mg/dL).    No Known Allergies  Antimicrobials this admission:       Dose adjustments this admission:   Microbiology results:  BCx:   UCx:    Sputum:    MRSA PCR:   Thank you for allowing pharmacy to be a part of this patient's care.  Carola FrostNathan A Sonakshi Rolland, Pharm.D., BCPS Clinical Pharmacist 01/27/2018 2:27 PM

## 2018-01-27 NOTE — Progress Notes (Signed)
Family Meeting Note  Advance Directive:yes  Today a meeting took place with the Patient, wife/son.  Patient is unable to participate due WG:NFAOZHto:Lacked capacity dementia   The following clinical team members were present during this meeting:MD  The following were discussed:Patient's diagnosis: Dementia, Patient's progosis: Unable to determine and Goals for treatment: DNR  Additional follow-up to be provided: prn  Time spent during discussion:20 minutes  Bertrum SolMontell D Salary, MD

## 2018-01-28 ENCOUNTER — Encounter: Payer: Self-pay | Admitting: Internal Medicine

## 2018-01-28 DIAGNOSIS — G9340 Encephalopathy, unspecified: Secondary | ICD-10-CM | POA: Diagnosis not present

## 2018-01-28 DIAGNOSIS — Z87891 Personal history of nicotine dependence: Secondary | ICD-10-CM | POA: Diagnosis not present

## 2018-01-28 DIAGNOSIS — F0390 Unspecified dementia without behavioral disturbance: Secondary | ICD-10-CM | POA: Diagnosis not present

## 2018-01-28 DIAGNOSIS — J309 Allergic rhinitis, unspecified: Secondary | ICD-10-CM | POA: Diagnosis not present

## 2018-01-28 DIAGNOSIS — Z66 Do not resuscitate: Secondary | ICD-10-CM | POA: Diagnosis not present

## 2018-01-28 DIAGNOSIS — G92 Toxic encephalopathy: Secondary | ICD-10-CM | POA: Diagnosis not present

## 2018-01-28 DIAGNOSIS — J690 Pneumonitis due to inhalation of food and vomit: Secondary | ICD-10-CM | POA: Diagnosis not present

## 2018-01-28 DIAGNOSIS — R05 Cough: Secondary | ICD-10-CM | POA: Diagnosis not present

## 2018-01-28 LAB — CBC WITH DIFFERENTIAL/PLATELET
BASOS PCT: 1 %
Basophils Absolute: 0.1 10*3/uL (ref 0–0.1)
EOS ABS: 0.5 10*3/uL (ref 0–0.7)
EOS PCT: 5 %
HCT: 39.3 % — ABNORMAL LOW (ref 40.0–52.0)
Hemoglobin: 13.7 g/dL (ref 13.0–18.0)
Lymphocytes Relative: 12 %
Lymphs Abs: 1.2 10*3/uL (ref 1.0–3.6)
MCH: 33.2 pg (ref 26.0–34.0)
MCHC: 34.8 g/dL (ref 32.0–36.0)
MCV: 95.6 fL (ref 80.0–100.0)
Monocytes Absolute: 0.9 10*3/uL (ref 0.2–1.0)
Monocytes Relative: 9 %
NEUTROS PCT: 73 %
Neutro Abs: 7.3 10*3/uL — ABNORMAL HIGH (ref 1.4–6.5)
PLATELETS: 151 10*3/uL (ref 150–440)
RBC: 4.11 MIL/uL — AB (ref 4.40–5.90)
RDW: 13.6 % (ref 11.5–14.5)
WBC: 9.9 10*3/uL (ref 3.8–10.6)

## 2018-01-28 LAB — STREP PNEUMONIAE URINARY ANTIGEN: STREP PNEUMO URINARY ANTIGEN: NEGATIVE

## 2018-01-28 MED ORDER — MEMANTINE HCL 5 MG PO TABS
10.0000 mg | ORAL_TABLET | Freq: Two times a day (BID) | ORAL | Status: DC
  Administered 2018-01-28: 10 mg via ORAL
  Filled 2018-01-28: qty 2

## 2018-01-28 MED ORDER — HALOPERIDOL LACTATE 5 MG/ML IJ SOLN
1.0000 mg | Freq: Once | INTRAMUSCULAR | Status: AC
  Administered 2018-01-28: 1 mg via INTRAMUSCULAR
  Filled 2018-01-28: qty 1

## 2018-01-28 MED ORDER — RISPERIDONE 0.25 MG PO TABS: 0.2500 mg | ORAL_TABLET | Freq: Two times a day (BID) | ORAL | Status: DC

## 2018-01-28 MED ORDER — ESCITALOPRAM OXALATE 10 MG PO TABS
10.0000 mg | ORAL_TABLET | Freq: Every day | ORAL | Status: DC
Start: 2018-01-28 — End: 2018-05-31

## 2018-01-28 MED ORDER — DONEPEZIL HCL 5 MG PO TABS
10.0000 mg | ORAL_TABLET | Freq: Every day | ORAL | Status: DC
  Administered 2018-01-28: 10 mg via ORAL
  Filled 2018-01-28 (×2): qty 2

## 2018-01-28 MED ORDER — RISPERIDONE 0.25 MG PO TABS: 0.2500 mg | ORAL_TABLET | Freq: Two times a day (BID) | ORAL | Status: AC

## 2018-01-28 MED ORDER — HALOPERIDOL 1 MG PO TABS
1.0000 mg | ORAL_TABLET | Freq: Once | ORAL | Status: AC
  Filled 2018-01-28: qty 1

## 2018-01-28 MED ORDER — RISPERIDONE 0.25 MG PO TABS
0.2500 mg | ORAL_TABLET | Freq: Every day | ORAL | Status: DC
  Administered 2018-01-28: 0.25 mg via ORAL
  Filled 2018-01-28: qty 1

## 2018-01-28 MED ORDER — AMOXICILLIN-POT CLAVULANATE 875-125 MG PO TABS: 1.0000 | ORAL_TABLET | Freq: Two times a day (BID) | ORAL | 0 refills | Status: DC

## 2018-01-28 MED ORDER — DONEPEZIL HCL 10 MG PO TABS: ORAL_TABLET | ORAL | 3 refills | Status: DC

## 2018-01-28 MED ORDER — RISPERIDONE 0.5 MG PO TABS
0.5000 mg | ORAL_TABLET | Freq: Every day | ORAL | Status: DC
  Filled 2018-01-28: qty 1

## 2018-01-28 NOTE — Discharge Summary (Addendum)
[facility] [place] [facility]   PATIENT NAME: Anvith Mauriello    MR#:  [id number]  DATE OF BIRTH:  02-05-1946  DATE OF ADMISSION:  01/27/2018 ADMITTING PHYSICIAN: Bertrum Sol, MD  DATE OF DISCHARGE: 01/28/2018  PRIMARY CARE PHYSICIAN: Joaquim Nam, MD    ADMISSION DIAGNOSIS:  Cough [R05] Altered mental status, unspecified altered mental status type [R41.82] Aspiration pneumonia of both lower lobes, unspecified aspiration pneumonia type (HCC) [J69.0]  DISCHARGE DIAGNOSIS:  Active Problems:   Aspiration pneumonia (HCC)   SECONDARY DIAGNOSIS:   Past Medical History:  Diagnosis Date  . Allergy   . Memory loss   . UTI (lower urinary tract infection)    one episode    HOSPITAL COURSE:  72 y/o male with moderate/severe dementia visiting from [place] presented with worsening mental status from baseline.  1.Aspiration pneumonia; Due to worsening dementia Patient clinically doing better Needs aspiration precautions and chopped up food. He would  Benefit from outpatient speech evaluation which was discussed with his wife. He will be disharged on Augmentin.   2. cute metabolic encephalopathy Compounded by underlying  dementia His mental status is at baseline per family  3 Dementia He will continue outpatient medications and have follow up with his PCP/neurologist. I did discuss with wife that he would benefit from palliative care services at home.      DISCHARGE CONDITIONS AND DIET:   Stable Chopped up foods aspiration precautions  CONSULTS OBTAINED:    DRUG ALLERGIES:  No Known Allergies  DISCHARGE MEDICATIONS:   Allergies as of 01/28/2018   No Known Allergies     Medication List    TAKE these medications   amoxicillin-clavulanate 875-125 MG tablet Commonly known as:  AUGMENTIN Take 1 tablet by mouth 2 (two) times daily.   benzonatate 200 MG capsule Commonly known as:  TESSALON Take 1 capsule (200 mg total) by mouth 3  (three) times daily as needed.   donepezil 10 MG tablet Commonly known as:  ARICEPT TAKE 1 TABLET BY MOUTH DAILY IN THE MORNING What changed:    how much to take  how to take this  when to take this  additional instructions   escitalopram 10 MG tablet Commonly known as:  LEXAPRO Take 1 tablet (10 mg total) by mouth at bedtime.   fexofenadine 180 MG tablet Commonly known as:  ALLEGRA Take 180 mg by mouth daily as needed for allergies.   memantine 10 MG tablet Commonly known as:  NAMENDA Take 1 tablet (10 mg total) by mouth 2 (two) times daily.   risperiDONE 0.25 MG tablet Commonly known as:  RISPERDAL Take 1 tablet (0.25 mg total) by mouth 2 (two) times daily.         Today   CHIEF COMPLAINT:  Patient was agitated at night  Better this am per wife   VITAL SIGNS:  Blood pressure 128/76, pulse 72, temperature (!) 97.5 F (36.4 C), temperature source Oral, resp. rate 16, height 5\' 8"  (1.727 m), weight 195 lb (88.5 kg), SpO2 98 %.   REVIEW OF SYSTEMS:  Review of Systems  Unable to perform ROS: Dementia     PHYSICAL EXAMINATION:  GENERAL:  72 y.o.-year-old patient lying in the bed with no acute distress.  NECK:  Supple, no jugular venous distention. No thyroid enlargement, no tenderness.  LUNGS: Normal breath sounds bilaterally, no wheezing, rales,rhonchi  No use of accessory muscles of respiration.  CARDIOVASCULAR: S1, S2 normal. No murmurs, rubs, or gallops.  ABDOMEN: Soft, non-tender, non-distended. Bowel sounds present. No organomegaly or mass.  EXTREMITIES: No pedal edema, cyanosis, or clubbing.  PSYCHIATRIC: The patient is alert and oriented x 0 dementia  SKIN: No obvious rash, lesion, or ulcer.   DATA REVIEW:   CBC Recent Labs  Lab 01/28/18 0406  WBC 9.9  HGB 13.7  HCT 39.3*  PLT 151    Chemistries  Recent Labs  Lab 01/27/18 1304  NA 139  K 3.7  CL 106  CO2 27  GLUCOSE 103*  BUN 22  CREATININE 1.07  CALCIUM 8.9  AST 18  ALT  10  ALKPHOS 81  BILITOT 1.5*    Cardiac Enzymes No results for input(s): TROPONINI in the last 168 hours.  Microbiology Results  @[email or web address] @  RADIOLOGY:  Ct Head Wo Contrast  Result Date: 01/27/2018 CLINICAL DATA:  Altered mental status. Cough and increased confusion since Thursday. Not feeling well. EXAM: CT HEAD WITHOUT CONTRAST TECHNIQUE: Contiguous axial images were obtained from the base of the skull through the vertex without intravenous contrast. COMPARISON:  10/23/2015 FINDINGS: Brain: There is moderate central and cortical atrophy. There is no intra or extra-axial fluid collection or mass lesion. The basilar cisterns and ventricles have a normal appearance. There is no CT evidence for acute infarction or hemorrhage. Vascular: There is atherosclerotic calcification of the internal carotid arteries. Skull: Normal. Negative for fracture or focal lesion. Sinuses/Orbits: No acute finding. Other: None. IMPRESSION: 1. Atrophy. 2.  No evidence for acute intracranial abnormality. Electronically Signed   By: Norva Pavlov M.D.   On: 01/27/2018 14:06   Ct Chest Wo Contrast  Result Date: 01/27/2018 CLINICAL DATA:  Cough with increased confusion and malaise for 2 days. Evaluate for pneumonia. EXAM: CT CHEST WITHOUT CONTRAST TECHNIQUE: Multidetector CT imaging of the chest was performed following the standard protocol without IV contrast. COMPARISON:  Chest radiographs 01/27/2018 and 10/24/2015. FINDINGS: Cardiovascular: Mild atherosclerosis of the aorta, great vessels and coronary arteries. No acute vascular findings on noncontrast imaging. The heart size is normal. There is no pericardial effusion. Mediastinum/Nodes: There are no enlarged mediastinal, hilar or axillary lymph nodes.There is an indeterminate density right thyroid nodule measuring 2.5 x 2.0 cm on image 30/2. There is mild tracheal deviation to the right by vascular structures. The esophagus appears normal. Lungs/Pleura: No significant  pleural effusion. There are dependent airspace opacities at both lung bases, left greater than right. There is associated central airway thickening, and the left lower lobe bronchus appears narrowed. No confluent airspace opacity or suspicious pulmonary nodule. Upper abdomen: No acute findings are seen within the visualized upper abdomen. Musculoskeletal/Chest wall: There is no chest wall mass or suspicious osseous finding. Degenerative changes are present throughout the spine. IMPRESSION: 1. Dependent airspace opacities at both lung bases, left greater than right. These are non-specific and may reflect atelectasis, although early aspiration is a possibility. Radiographic follow up recommended. 2. Mild Aortic Atherosclerosis (ICD10-I70.0). 3. Indeterminate right thyroid nodule. If further evaluation indicated clinically, this would be best evaluated by thyroid ultrasound. Electronically Signed   By: Carey Bullocks M.D.   On: 01/27/2018 14:12   Dg Chest Port 1 View  Result Date: 01/27/2018 CLINICAL DATA:  Cough and congestion EXAM: PORTABLE CHEST 1 VIEW COMPARISON:  10/24/2015 FINDINGS: The heart size and mediastinal contours are within normal limits. Both lungs are well aerated. Mild atelectatic changes are noted in the left mid lung. The visualized skeletal structures are unremarkable. IMPRESSION: Mild left midlung atelectatic changes Electronically Signed  By: Alcide CleverMark  Lukens M.D.   On: 01/27/2018 13:34      Allergies as of 01/28/2018   No Known Allergies     Medication List    TAKE these medications   amoxicillin-clavulanate 875-125 MG tablet Commonly known as:  AUGMENTIN Take 1 tablet by mouth 2 (two) times daily.   benzonatate 200 MG capsule Commonly known as:  TESSALON Take 1 capsule (200 mg total) by mouth 3 (three) times daily as needed.   donepezil 10 MG tablet Commonly known as:  ARICEPT TAKE 1 TABLET BY MOUTH DAILY IN THE MORNING What changed:    how much to take  how to take  this  when to take this  additional instructions   escitalopram 10 MG tablet Commonly known as:  LEXAPRO Take 1 tablet (10 mg total) by mouth at bedtime.   fexofenadine 180 MG tablet Commonly known as:  ALLEGRA Take 180 mg by mouth daily as needed for allergies.   memantine 10 MG tablet Commonly known as:  NAMENDA Take 1 tablet (10 mg total) by mouth 2 (two) times daily.   risperiDONE 0.25 MG tablet Commonly known as:  RISPERDAL Take 1 tablet (0.25 mg total) by mouth 2 (two) times daily.          Management plans discussed with the patient's wife and she is in agreement. Stable for discharge   Patient should follow up with pcp  CODE STATUS:     Code Status Orders  (From admission, onward)        Start     Ordered   01/27/18 1523  Do not attempt resuscitation (DNR)  Continuous    Question Answer Comment  In the event of cardiac or respiratory ARREST Do not call a "code blue"   In the event of cardiac or respiratory ARREST Do not perform Intubation, CPR, defibrillation or ACLS   In the event of cardiac or respiratory ARREST Use medication by any route, position, wound care, and other measures to relive pain and suffering. May use oxygen, suction and manual treatment of airway obstruction as needed for comfort.   Comments Nurse may pronounce      01/27/18 1523    Code Status History    Date Active Date Inactive Code Status Order ID Comments User Context   01/27/2018 1522 01/27/2018 1522 Full Code [id number]  Salary, Evelena AsaMontell D, MD ED    Advance Directive Documentation     Most Recent Value  Type of Advance Directive  Healthcare Power of Attorney  Pre-existing out of facility DNR order (yellow form or pink MOST form)  -  "MOST" Form in Place?  -      TOTAL TIME TAKING CARE OF THIS PATIENT: 38 minutes.    Note: This dictation was prepared with Dragon dictation along with smaller phrase technology. Any transcriptional errors that result from this process are  unintentional.  Zayley Arras M.D on 01/28/2018 at 9:54 AM  Between 7am to 6pm - Pager - [phone] After 6pm go to [email or web address] - Social research officer, [email or web address] [email or web address]  [facility] [place] [facility]  Office  [phone]  CC: Primary care physician; Joaquim Namuncan, Graham S, MD

## 2018-01-28 NOTE — Progress Notes (Signed)
Physical Therapy Evaluation Patient Details Name: Bryan Berry MRN: [id number] DOB: [birth year] Today's Date: 01/28/2018   History of Present Illness  Bryan Berry  is a 72 y.o. male with a known history per below presenting to the emergency room with 2-day history of worsening productive cough, increasing confusion, decrease in activities of daily living, lethargy, in the emergency room work-up noted for bilateral pneumonia suspicious for aspiration, white count 13,000 total bili 1.5, ammonia levels normal, patient evaluated emergency room, wife/son at the bedside, patient in no apparent distress, resting comfortably in bed, patient is poor historian due to dementia, patient now be admitted for acute aspiration pneumonia with acute encephalopathy compounded by dementia.  Clinical Impression  Patient is 72 year old male with decreased mobility and slow movements for supine to sit , sit to supine mobility with modified independent assist. He transfers sit to stand with supervision and HHA. He standing balance is fair and sitting balance is good. He ambulates 200 feet with HHA and ascends and descends steps x 6 with rail and mod assist for safety and max assist for cuing. He has help at home and DC plan is to return to sons house and then return home with wife.     Follow Up Recommendations      Equipment Recommendations       Recommendations for Other Services       Precautions / Restrictions Precautions Precautions: Fall Restrictions Weight Bearing Restrictions: No      Mobility  Bed Mobility Overal bed mobility: Modified Independent                Transfers Overall transfer level: Modified independent Equipment used: None                Ambulation/Gait Ambulation/Gait assistance: Modified independent (Device/Increase time)   Assistive device: None Gait Pattern/deviations: Decreased step length - right;Decreased step length - left;Decreased stance time -  right;Decreased stance time - left        Stairs Stairs: Yes Stairs assistance: Min assist Stair Management: One rail Right Number of Stairs: (6) General stair comments: (Patient needs cues to use the rails )  Wheelchair Mobility    Modified Rankin (Stroke Patients Only)       Balance Overall balance assessment: Needs assistance   Sitting balance-Leahy Scale: Fair                                       Pertinent Vitals/Pain Pain Assessment: No/denies pain    Home Living Family/patient expects to be discharged to:: Private residence Living Arrangements: Spouse/significant other Available Help at Discharge: Family Type of Home: House Home Access: Stairs to enter Entrance Stairs-Rails: Right Entrance Stairs-Number of Steps: 4 Home Layout: One level Home Equipment: None      Prior Function Level of Independence: Independent               Hand Dominance        Extremity/Trunk Assessment   Upper Extremity Assessment Upper Extremity Assessment: Overall WFL for tasks assessed            Communication   Communication: No difficulties  Cognition     Overall Cognitive Status: History of cognitive impairments - at baseline  General Comments      Exercises     Assessment/Plan    PT Assessment Patient needs continued PT services  PT Problem List Decreased strength;Decreased balance;Decreased mobility;Decreased cognition       PT Treatment Interventions Gait training;Stair training    PT Goals (Current goals can be found in the Care Plan section)  Acute Rehab PT Goals Patient Stated Goal: (none stated) PT Goal Formulation: With patient/family Time For Goal Achievement: 02/18/18 Potential to Achieve Goals: Good    Frequency Min 2X/week   Barriers to discharge        Co-evaluation               AM-PAC PT "6 Clicks" Daily Activity  Outcome Measure    Difficulty moving from lying on back to sitting on the side of the bed? : A Little Difficulty sitting down on and standing up from a chair with arms (e.g., wheelchair, bedside commode, etc,.)?: A Little Help needed moving to and from a bed to chair (including a wheelchair)?: A Little Help needed walking in hospital room?: A Little Help needed climbing 3-5 steps with a railing? : A Lot 6 Click Score: 14    End of Session Equipment Utilized During Treatment: Gait belt   Patient left: in bed;with bed alarm set   PT Visit Diagnosis: Difficulty in walking, not elsewhere classified (R26.2)    Time: 1000-1030 PT Time Calculation (min) (ACUTE ONLY): 30 min   Charges:   PT Evaluation $PT Eval Low Complexity: 1 Low PT Treatments $Gait Training: 8-22 mins $Therapeutic Activity: 8-22 mins          Ezekiel InaMansfield, Lynnie Koehler S, PT DPT 01/28/2018, 12:34 PM

## 2018-01-28 NOTE — Progress Notes (Signed)
Pt wheeled to car by staff. 

## 2018-01-28 NOTE — Progress Notes (Signed)
DISCHARGE NOTE:  Pts wife at the bedside. Discharge instructions given. Pt waiting on ride.

## 2018-01-29 ENCOUNTER — Telehealth: Payer: Self-pay | Admitting: *Deleted

## 2018-01-29 NOTE — Telephone Encounter (Signed)
Lm requesting return call to complete TCM and confirm hosp f/u appt  

## 2018-01-30 ENCOUNTER — Telehealth: Payer: Self-pay

## 2018-01-30 LAB — LEGIONELLA PNEUMOPHILA SEROGP 1 UR AG: L. PNEUMOPHILA SEROGP 1 UR AG: NEGATIVE

## 2018-01-30 LAB — HIV ANTIBODY (ROUTINE TESTING W REFLEX): HIV Screen 4th Generation wRfx: NONREACTIVE

## 2018-01-30 NOTE — Telephone Encounter (Signed)
Transition Care Management Follow-up Telephone Call Pts wife Bryan Berry completed TCM     Date discharged? 01-28-2018   How have you been since you were released from the hospital? "he has a horrible cough. We've had to put him in the recliner just so he can sleep, but he had a better night last night   Do you understand why you were in the hospital? yes   Do you understand the discharge instructions? yes   Where were you discharged to? "his son's home"   Items Reviewed:  Medications reviewed: yes  Allergies reviewed: yes  Dietary changes reviewed: yes  Referrals reviewed: yes   Functional Questionnaire:   Activities of Daily Living (ADLs):   States they require assistance with the following: needs assistance in all areas; wife is there to assist   Any transportation issues/concerns?: no   Any patient concerns? Yes; "his cough"   Confirmed importance and date/time of follow-up visits scheduled yes  Provider Appointment booked with Dr Para Marchuncan 8/12  Confirmed with patient if condition begins to worsen call PCP or go to the ER.  Patient was given the office number and encouraged to call back with question or concerns.  : yes

## 2018-01-30 NOTE — Telephone Encounter (Signed)
Copied from CRM [phone]. Topic: General - Other >> Jan 29, 2018  2:56 PM Gerrianne ScalePayne, Angela L wrote: Reason for CRM: pt wife Kara Meadmma calling stating that he husband need a hospital f/u within a week for double pneumonia please give pt wife Kara Meadmma a call back with an appt at [phone]

## 2018-01-30 NOTE — Telephone Encounter (Signed)
appointment scheduled already and Waynetta did TCM call-Jiovanna Frei V Natasja Niday, RMA

## 2018-01-31 NOTE — Telephone Encounter (Signed)
Noted. Thanks.

## 2018-02-01 LAB — CULTURE, BLOOD (ROUTINE X 2)
Culture: NO GROWTH
Culture: NO GROWTH
Special Requests: ADEQUATE
Special Requests: ADEQUATE

## 2018-02-05 ENCOUNTER — Ambulatory Visit (INDEPENDENT_AMBULATORY_CARE_PROVIDER_SITE_OTHER): Payer: Medicare Other | Admitting: Family Medicine

## 2018-02-05 ENCOUNTER — Encounter: Payer: Self-pay | Admitting: Family Medicine

## 2018-02-05 VITALS — BP 110/80 | HR 86 | Temp 98.3°F | Ht 68.0 in | Wt 190.0 lb

## 2018-02-05 DIAGNOSIS — J690 Pneumonitis due to inhalation of food and vomit: Secondary | ICD-10-CM

## 2018-02-05 DIAGNOSIS — G301 Alzheimer's disease with late onset: Secondary | ICD-10-CM

## 2018-02-05 DIAGNOSIS — E041 Nontoxic single thyroid nodule: Secondary | ICD-10-CM

## 2018-02-05 DIAGNOSIS — F0280 Dementia in other diseases classified elsewhere without behavioral disturbance: Secondary | ICD-10-CM | POA: Diagnosis not present

## 2018-02-05 NOTE — Progress Notes (Signed)
DATE OF ADMISSION:  01/27/2018      ADMITTING PHYSICIAN: Bertrum SolMontell D Salary, MD  DATE OF DISCHARGE: 01/28/2018  PRIMARY CARE PHYSICIAN: Joaquim Namuncan, Adien Kimmel S, MD    ADMISSION DIAGNOSIS:  Cough [R05] Altered mental status, unspecified altered mental status type [R41.82] Aspiration pneumonia of both lower lobes, unspecified aspiration pneumonia type (HCC) [J69.0]  DISCHARGE DIAGNOSIS:  Active Problems:   Aspiration pneumonia (HCC)   SECONDARY DIAGNOSIS:       Past Medical History:  Diagnosis Date  . Allergy   . Memory loss   . UTI (lower urinary tract infection)    one episode    HOSPITAL COURSE:  72 y/o male with moderate/severe dementia visiting from [place] presented with worsening mental status from baseline.  1.Aspiration pneumonia; Due to worsening dementia Patient clinically doing better Needs aspiration precautions and chopped up food. He would  Benefit from outpatient speech evaluation which was discussed with his wife. He will be disharged on Augmentin.   2. cute metabolic encephalopathy Compounded by underlying  dementia His mental status is at baseline per family  3 Dementia He will continue outpatient medications and have follow up with his PCP/neurologist. I did discuss with wife that he would benefit from palliative care services at home.  ============================================= Inpatient follow-up.  History of dementia.  Presumed aspiration pneumonia.  He was found to have respiratory changes and was transported to hospital.  Admitted and treated supportively for presumed aspiration pneumonia.  Patient improved and was able to be discharged home.  He is still on Augmentin.  Wife is still caring for him. Patient has had more confusion in the meantime, with this acute illness.  Discussed. Still with wet cough but no sputum or fever.  Was sleeping in a recliner and did better with that prior to moving back to a bed and sleeping propped up.    Aspiration cautions d/w pt and wife.    We talked about options re: swallowing testing.  It likely wouldn't change mgmt at this point; he would continue with aspiration cautions anyway.  It would be okay to defer for now.  If his wife wanted to pursue options with thickening agents for his diet then we could consider getting swallowing evaluation done at that point to help make a selection.  Otherwise it would be okay to defer for now.  We talked about PEG tube not preventing aspiration.  He slept better in the recliner, discussed options for sleep at night- wedge pillow in bed, etc.   Wife is checking on getting extra help at home.  I'll await update from wife.  They are staying with their son in the meantime, to get some extra help.    Indeterminate right thyroid nodule noted on imaging as inpatient. If further evaluation indicated clinically, this would be best evaluated by thyroid Ultrasound.  Would defer further testing for now.  There is several issues to consider.  1.  Would the patient tolerate going to the ultrasound.  2.  Would he be able to tolerate or want a biopsy if that ultrasound were abnormal.  3.  Would he then have treatment if the biopsy were abnormal.  If his dementia would prevent him from being a good candidate for treatment then it may make sense to defer the follow-up ultrasound for now.  Discussed with wife.  She will consider.  See avs.   PMH and SH reviewed  ROS: Per HPI unless specifically indicated in ROS section   Meds, vitals, and allergies  reviewed.   GEN: nad, alert but gets confused in conversation.   HEENT: mucous membranes moist NECK: supple w/o LA CV: rrr PULM: ctab, no inc wob ABD: soft, +bs EXT: no edema SKIN: well perfused.  Slow by symmetric gait.

## 2018-02-05 NOTE — Patient Instructions (Signed)
Finish the antibiotics and update me as needed later in the week.  Let me know if I can be of service with getting extra help at home.  Use a wedge with a pillow or sleep in the recliner.  Chop up foods, small bites, monitor with and after meals.  Eat sitting up and stay up right after meals.  I would hold off on extra testing for the thyroid nodule, at least at this point.   We can consider the swallowing testing if it will change the plan or improve care.  At this point, I don't think it will change the plan or help.  Update me as needed.   Take care.  Glad to see you.

## 2018-02-06 DIAGNOSIS — E041 Nontoxic single thyroid nodule: Secondary | ICD-10-CM | POA: Insufficient documentation

## 2018-02-06 NOTE — Assessment & Plan Note (Signed)
Indeterminate right thyroid nodule noted on imaging as inpatient. If further evaluation indicated clinically, this would be best evaluated by thyroid ultrasound.    Would defer further testing for now.  There is several issues to consider.  1.  Would the patient tolerate going to the ultrasound.  2.  Would he be able to tolerate or want a biopsy if that ultrasound were abnormal.  3.  Would he then have treatment if the biopsy were abnormal.  If his dementia would prevent him from being a good candidate for treatment then it may make sense to defer the follow-up ultrasound for now.  Discussed with wife.  She will consider.

## 2018-02-06 NOTE — Assessment & Plan Note (Signed)
Likely the underlying issue.  He had more confusion as expected with the acute illness.  She is staying with her son and that helps some with care needs.  She will update me as needed.  She is checking on getting extra help at home.

## 2018-02-06 NOTE — Assessment & Plan Note (Signed)
Likely related to dementia.  Continue Augmentin.  Continue routine aspiration precautions.  Defer swallow testing for now.  It would likely not change the plan.  Discussed with wife.  She agrees.  See above. Lungs are clear.

## 2018-04-02 ENCOUNTER — Telehealth: Payer: Self-pay | Admitting: Adult Health

## 2018-04-02 MED ORDER — RIVASTIGMINE 4.6 MG/24HR TD PT24: 4.6000 mg | MEDICATED_PATCH | Freq: Every day | TRANSDERMAL | 12 refills | Status: DC

## 2018-04-02 NOTE — Telephone Encounter (Signed)
Pt wife(on [employer],Emma) has called asking if there is a way she would be allowed to crush any or all of pt's medications so that she could put them in his food or a beverage.  Wife states from time to time pt refuses to take meds or will just spit them out.  Please call

## 2018-04-02 NOTE — Telephone Encounter (Signed)
I called in the Exelon patch 4.6 mg.  He should discontinue Aricept.  Exelon and Aricept should not be taken together.  If he is unable to tolerate the patch they should let us know.

## 2018-04-02 NOTE — Telephone Encounter (Addendum)
Spoke with wife who stated that on some occassions the patient will either refuse to take at all, chew, spit out or let  namenda and aricept sit on his tongue. She asked if either could be crushed. This RN advised that they should not be crushed, and if he is spitting them out, that would not be helpful. This RN advised will send her concerns to NP and call her back with reply. Confirmed her pharmacy.  She verbalized understanding, appreciation.

## 2018-04-02 NOTE — Addendum Note (Signed)
Addended by: Enedina Finner on: 04/02/2018 03:40 PM   Modules accepted: Orders

## 2018-04-03 MED ORDER — DONEPEZIL HCL 10 MG PO TABS: ORAL_TABLET | ORAL | 3 refills | Status: DC

## 2018-04-03 NOTE — Addendum Note (Signed)
Addended by: Enedina Finner on: 04/03/2018 02:28 PM   Modules accepted: Orders

## 2018-04-03 NOTE — Telephone Encounter (Signed)
Spoke with wife and advised her of NP's message. Advised she may try giving him his favorite snack with medicine or any other method she feels may be helpful to encourage him to take it. She stated that is what she prefers to do, and she will not pick up the patches. She asked that the Aricept be reordered so she can pick it up. She verbalized understanding, appreciation of call back.

## 2018-04-03 NOTE — Telephone Encounter (Signed)
I would try to get him to stay on the orals. If he refusing one night that's ok. If he initially refuses to take the medication she may want to move on to something else but then revisit him taking his medication.

## 2018-04-03 NOTE — Telephone Encounter (Addendum)
Spoke with wife who stated the pharmacy called her yesterday. Her one month cost for Exelon patches is $ 275 w/insurance. She is asking if anything else could be prescribed. She stated that last night he swallowed both pills, but she cannot be sure he will every day. Her pharmacist advised if no other options instead of patch she would just have to continue with tablets. This RN reviewed prices per [email or web address].[facility].[email or web address]/. She stated [place] [facility]. Per [facility] 30 day supply is $146. This RN advised will discuss with NP and call her later today. She verbalized understanding, appreciation.

## 2018-05-16 DIAGNOSIS — H47323 Drusen of optic disc, bilateral: Secondary | ICD-10-CM | POA: Diagnosis not present

## 2018-05-16 DIAGNOSIS — H0102B Squamous blepharitis left eye, upper and lower eyelids: Secondary | ICD-10-CM | POA: Diagnosis not present

## 2018-05-16 DIAGNOSIS — Z961 Presence of intraocular lens: Secondary | ICD-10-CM | POA: Diagnosis not present

## 2018-05-16 DIAGNOSIS — H0102A Squamous blepharitis right eye, upper and lower eyelids: Secondary | ICD-10-CM | POA: Diagnosis not present

## 2018-05-21 ENCOUNTER — Emergency Department: Payer: Medicare Other

## 2018-05-21 ENCOUNTER — Encounter: Payer: Self-pay | Admitting: Emergency Medicine

## 2018-05-21 ENCOUNTER — Other Ambulatory Visit: Payer: Self-pay

## 2018-05-21 ENCOUNTER — Inpatient Hospital Stay
Admission: EM | Admit: 2018-05-21 | Discharge: 2018-05-30 | DRG: 177 | Disposition: A | Discharge status: Hospice | Payer: Medicare Other | Attending: Internal Medicine | Admitting: Internal Medicine

## 2018-05-21 ENCOUNTER — Inpatient Hospital Stay: Payer: Medicare Other

## 2018-05-21 DIAGNOSIS — Z9841 Cataract extraction status, right eye: Secondary | ICD-10-CM | POA: Diagnosis not present

## 2018-05-21 DIAGNOSIS — J690 Pneumonitis due to inhalation of food and vomit: Principal | ICD-10-CM | POA: Diagnosis present

## 2018-05-21 DIAGNOSIS — Z9842 Cataract extraction status, left eye: Secondary | ICD-10-CM | POA: Diagnosis not present

## 2018-05-21 DIAGNOSIS — Z961 Presence of intraocular lens: Secondary | ICD-10-CM | POA: Diagnosis present

## 2018-05-21 DIAGNOSIS — Z87891 Personal history of nicotine dependence: Secondary | ICD-10-CM

## 2018-05-21 DIAGNOSIS — Z8249 Family history of ischemic heart disease and other diseases of the circulatory system: Secondary | ICD-10-CM | POA: Diagnosis not present

## 2018-05-21 DIAGNOSIS — F0280 Dementia in other diseases classified elsewhere without behavioral disturbance: Secondary | ICD-10-CM | POA: Diagnosis not present

## 2018-05-21 DIAGNOSIS — R627 Adult failure to thrive: Secondary | ICD-10-CM | POA: Diagnosis present

## 2018-05-21 DIAGNOSIS — R05 Cough: Secondary | ICD-10-CM

## 2018-05-21 DIAGNOSIS — R0989 Other specified symptoms and signs involving the circulatory and respiratory systems: Secondary | ICD-10-CM | POA: Diagnosis not present

## 2018-05-21 DIAGNOSIS — G9341 Metabolic encephalopathy: Secondary | ICD-10-CM | POA: Diagnosis present

## 2018-05-21 DIAGNOSIS — J189 Pneumonia, unspecified organism: Secondary | ICD-10-CM | POA: Diagnosis present

## 2018-05-21 DIAGNOSIS — R509 Fever, unspecified: Secondary | ICD-10-CM | POA: Diagnosis not present

## 2018-05-21 DIAGNOSIS — Z79899 Other long term (current) drug therapy: Secondary | ICD-10-CM

## 2018-05-21 DIAGNOSIS — R600 Localized edema: Secondary | ICD-10-CM | POA: Diagnosis not present

## 2018-05-21 DIAGNOSIS — Z23 Encounter for immunization: Secondary | ICD-10-CM

## 2018-05-21 DIAGNOSIS — R609 Edema, unspecified: Secondary | ICD-10-CM

## 2018-05-21 DIAGNOSIS — Z6826 Body mass index (BMI) 26.0-26.9, adult: Secondary | ICD-10-CM

## 2018-05-21 DIAGNOSIS — R059 Cough, unspecified: Secondary | ICD-10-CM

## 2018-05-21 DIAGNOSIS — M2550 Pain in unspecified joint: Secondary | ICD-10-CM | POA: Diagnosis not present

## 2018-05-21 DIAGNOSIS — F0390 Unspecified dementia without behavioral disturbance: Secondary | ICD-10-CM | POA: Diagnosis present

## 2018-05-21 DIAGNOSIS — R4182 Altered mental status, unspecified: Secondary | ICD-10-CM | POA: Diagnosis not present

## 2018-05-21 DIAGNOSIS — R404 Transient alteration of awareness: Secondary | ICD-10-CM | POA: Diagnosis not present

## 2018-05-21 DIAGNOSIS — Z7401 Bed confinement status: Secondary | ICD-10-CM | POA: Diagnosis not present

## 2018-05-21 DIAGNOSIS — G301 Alzheimer's disease with late onset: Secondary | ICD-10-CM | POA: Diagnosis not present

## 2018-05-21 DIAGNOSIS — G039 Meningitis, unspecified: Secondary | ICD-10-CM

## 2018-05-21 LAB — GLUCOSE, CAPILLARY: Glucose-Capillary: 91 mg/dL (ref 70–99)

## 2018-05-21 LAB — URINALYSIS, COMPLETE (UACMP) WITH MICROSCOPIC
BACTERIA UA: NONE SEEN
BILIRUBIN URINE: NEGATIVE
Glucose, UA: NEGATIVE mg/dL
Ketones, ur: 5 mg/dL — AB
Leukocytes, UA: NEGATIVE
Nitrite: NEGATIVE
Protein, ur: NEGATIVE mg/dL
SPECIFIC GRAVITY, URINE: 1.025 (ref 1.005–1.030)
pH: 5 (ref 5.0–8.0)

## 2018-05-21 LAB — COMPREHENSIVE METABOLIC PANEL
ALBUMIN: 3.7 g/dL (ref 3.5–5.0)
ALK PHOS: 104 U/L (ref 38–126)
ALT: 19 U/L (ref 0–44)
AST: 21 U/L (ref 15–41)
Anion gap: 9 (ref 5–15)
BILIRUBIN TOTAL: 1.3 mg/dL — AB (ref 0.3–1.2)
BUN: 25 mg/dL — ABNORMAL HIGH (ref 8–23)
CALCIUM: 8.9 mg/dL (ref 8.9–10.3)
CO2: 25 mmol/L (ref 22–32)
Chloride: 102 mmol/L (ref 98–111)
Creatinine, Ser: 0.96 mg/dL (ref 0.61–1.24)
GFR calc Af Amer: >60 mL/min (ref >60)
GFR calc non Af Amer: >60 mL/min (ref >60)
GLUCOSE: 97 mg/dL (ref 70–99)
Potassium: 3.9 mmol/L (ref 3.5–5.1)
Sodium: 136 mmol/L (ref 135–145)
TOTAL PROTEIN: 6.6 g/dL (ref 6.5–8.1)

## 2018-05-21 LAB — CBC WITH DIFFERENTIAL/PLATELET
Abs Immature Granulocytes: 0.04 10*3/uL (ref 0.00–0.07)
BASOS ABS: 0 10*3/uL (ref 0.0–0.1)
Basophils Relative: 0 %
EOS PCT: 1 %
Eosinophils Absolute: 0.1 10*3/uL (ref 0.0–0.5)
HCT: 42 % (ref 39.0–52.0)
HEMOGLOBIN: 13.6 g/dL (ref 13.0–17.0)
IMMATURE GRANULOCYTES: 0 %
LYMPHS PCT: 4 %
Lymphs Abs: 0.5 10*3/uL — ABNORMAL LOW (ref 0.7–4.0)
MCH: 31.7 pg (ref 26.0–34.0)
MCHC: 32.4 g/dL (ref 30.0–36.0)
MCV: 97.9 fL (ref 80.0–100.0)
Monocytes Absolute: 1.2 10*3/uL — ABNORMAL HIGH (ref 0.1–1.0)
Monocytes Relative: 10 %
NEUTROS PCT: 85 %
NRBC: 0 % (ref 0.0–0.2)
Neutro Abs: 9.4 10*3/uL — ABNORMAL HIGH (ref 1.7–7.7)
Platelets: 186 10*3/uL (ref 150–400)
RBC: 4.29 MIL/uL (ref 4.22–5.81)
RDW: 12.9 % (ref 11.5–15.5)
WBC: 11.3 10*3/uL — AB (ref 4.0–10.5)

## 2018-05-21 LAB — BLOOD GAS, VENOUS
Acid-Base Excess: 1.3 mmol/L (ref 0.0–2.0)
BICARBONATE: 26.6 mmol/L (ref 20.0–28.0)
O2 Saturation: 78.6 %
PH VEN: 7.4 (ref 7.250–7.430)
PO2 VEN: 43 mmHg (ref 32.0–45.0)
Patient temperature: 37
pCO2, Ven: 43 mmHg — ABNORMAL LOW (ref 44.0–60.0)

## 2018-05-21 LAB — EXPECTORATED SPUTUM ASSESSMENT W GRAM STAIN, RFLX TO RESP C

## 2018-05-21 LAB — MRSA PCR SCREENING: MRSA by PCR: NEGATIVE

## 2018-05-21 LAB — TROPONIN I: Troponin I: <0.03 ng/mL (ref <0.03)

## 2018-05-21 LAB — EXPECTORATED SPUTUM ASSESSMENT W REFEX TO RESP CULTURE: SPECIAL REQUESTS: NORMAL

## 2018-05-21 LAB — LACTIC ACID, PLASMA: LACTIC ACID, VENOUS: 1.1 mmol/L (ref 0.5–1.9)

## 2018-05-21 MED ORDER — INFLUENZA VAC SPLIT HIGH-DOSE 0.5 ML IM SUSY
0.5000 mL | PREFILLED_SYRINGE | INTRAMUSCULAR | Status: AC
  Administered 2018-05-22: 10:00:00 0.5 mL via INTRAMUSCULAR
  Filled 2018-05-21: qty 0.5

## 2018-05-21 MED ORDER — ENOXAPARIN SODIUM 40 MG/0.4ML ~~LOC~~ SOLN
40.0000 mg | SUBCUTANEOUS | Status: DC
  Administered 2018-05-21 – 2018-05-30 (×9): 40 mg via SUBCUTANEOUS
  Filled 2018-05-21 (×9): qty 0.4

## 2018-05-21 MED ORDER — VANCOMYCIN HCL IN DEXTROSE 1-5 GM/200ML-% IV SOLN
1000.0000 mg | Freq: Once | INTRAVENOUS | Status: AC
  Administered 2018-05-21: 1000 mg via INTRAVENOUS
  Filled 2018-05-21: qty 200

## 2018-05-21 MED ORDER — SODIUM CHLORIDE 0.9 % IV SOLN
3.0000 g | Freq: Four times a day (QID) | INTRAVENOUS | Status: DC
  Administered 2018-05-21 – 2018-05-22 (×4): 3 g via INTRAVENOUS
  Filled 2018-05-21 (×6): qty 3

## 2018-05-21 MED ORDER — POLYETHYLENE GLYCOL 3350 17 G PO PACK: 17.0000 g | PACK | Freq: Every day | ORAL | Status: DC | PRN

## 2018-05-21 MED ORDER — ESCITALOPRAM OXALATE 10 MG PO TABS
10.0000 mg | ORAL_TABLET | Freq: Every day | ORAL | Status: DC
Start: 2018-05-21 — End: 2018-05-26
  Administered 2018-05-22 – 2018-05-24 (×3): 10 mg via ORAL
  Filled 2018-05-21 (×6): qty 1

## 2018-05-21 MED ORDER — ONDANSETRON HCL 4 MG PO TABS: 4.0000 mg | ORAL_TABLET | Freq: Four times a day (QID) | ORAL | Status: DC | PRN

## 2018-05-21 MED ORDER — HALOPERIDOL LACTATE 5 MG/ML IJ SOLN
2.0000 mg | INTRAMUSCULAR | Status: DC | PRN
  Administered 2018-05-23: 2 mg via INTRAVENOUS
  Filled 2018-05-21: qty 1

## 2018-05-21 MED ORDER — LORATADINE 10 MG PO TABS
10.0000 mg | ORAL_TABLET | Freq: Every day | ORAL | Status: DC
  Administered 2018-05-22 – 2018-05-25 (×4): 10 mg via ORAL
  Filled 2018-05-21 (×4): qty 1

## 2018-05-21 MED ORDER — ACETAMINOPHEN 650 MG RE SUPP
650.0000 mg | Freq: Four times a day (QID) | RECTAL | Status: DC | PRN
  Administered 2018-05-25: 650 mg via RECTAL
  Filled 2018-05-21: qty 1

## 2018-05-21 MED ORDER — SODIUM CHLORIDE 0.9 % IV SOLN
INTRAVENOUS | Status: AC
  Administered 2018-05-21: 3 g via INTRAVENOUS
  Filled 2018-05-21: qty 3

## 2018-05-21 MED ORDER — SODIUM CHLORIDE 0.9 % IV SOLN
INTRAVENOUS | Status: DC
  Administered 2018-05-21 – 2018-05-23 (×4): via INTRAVENOUS

## 2018-05-21 MED ORDER — PIPERACILLIN-TAZOBACTAM 3.375 G IVPB 30 MIN
3.3750 g | Freq: Once | INTRAVENOUS | Status: AC
  Administered 2018-05-21: 3.375 g via INTRAVENOUS
  Filled 2018-05-21: qty 50

## 2018-05-21 MED ORDER — ONDANSETRON HCL 4 MG/2ML IJ SOLN: 4.0000 mg | Freq: Four times a day (QID) | INTRAMUSCULAR | Status: DC | PRN

## 2018-05-21 MED ORDER — MEMANTINE HCL 5 MG PO TABS
10.0000 mg | ORAL_TABLET | Freq: Two times a day (BID) | ORAL | Status: DC
  Administered 2018-05-22 – 2018-05-25 (×7): 10 mg via ORAL
  Filled 2018-05-21 (×8): qty 2

## 2018-05-21 MED ORDER — DONEPEZIL HCL 5 MG PO TABS
10.0000 mg | ORAL_TABLET | Freq: Every day | ORAL | Status: DC
  Administered 2018-05-22 – 2018-05-25 (×4): 10 mg via ORAL
  Filled 2018-05-21 (×9): qty 2

## 2018-05-21 MED ORDER — ACETAMINOPHEN 325 MG PO TABS
650.0000 mg | ORAL_TABLET | Freq: Four times a day (QID) | ORAL | Status: DC | PRN
  Administered 2018-05-24 – 2018-05-25 (×2): 650 mg via ORAL
  Filled 2018-05-21 (×3): qty 2

## 2018-05-21 MED ORDER — RISPERIDONE 0.25 MG PO TABS
0.2500 mg | ORAL_TABLET | Freq: Two times a day (BID) | ORAL | Status: DC
  Administered 2018-05-22 – 2018-05-25 (×7): 0.25 mg via ORAL
  Filled 2018-05-21 (×11): qty 1

## 2018-05-21 NOTE — ED Provider Notes (Signed)
[facility] Emergency Department Provider Note       Time seen: ----------------------------------------- 9:24 AM on 05/21/2018 ----------------------------------------- Level V caveat: History/ROS limited by altered mental status  I have reviewed the triage vital signs and the nursing notes.  HISTORY   Chief Complaint Altered Mental Status    HPI Bryan Berry is a 72 y.o. male with a history of allergies, dementia, UTI, aspiration pneumonia who presents to the ED for altered mental status.  According to the wife he has history of dementia but is normally awake and conversant and following commands.  Today he has had difficulty following commands and is harder to arouse.  She reports he was this way in the past when he had pneumonia.  He has had a noted cough over the last 24 hours.  Past Medical History:  Diagnosis Date  . Allergy   . Dementia (HCC)   . Memory loss   . UTI (lower urinary tract infection)    one episode    Patient Active Problem List   Diagnosis Date Noted  . Thyroid nodule 02/06/2018  . Aspiration pneumonia (HCC) 01/27/2018  . Cough 10/30/2017  . Medicare annual wellness visit, initial 02/10/2017  . Advance care planning 02/10/2017  . Alzheimer's disease (HCC) 05/28/2015  . Snoring 05/28/2015    Past Surgical History:  Procedure Laterality Date  . CATARACT EXTRACTION W/ INTRAOCULAR LENS  IMPLANT, BILATERAL     feb and march 2019  . VARICOSE VEIN SURGERY      Allergies Patient has no known allergies.  Social History Social History   Tobacco Use  . Smoking status: Former Games developer  . Smokeless tobacco: Never Used  . Tobacco comment: smoked a pipe, quit over 20 years ago  Substance Use Topics  . Alcohol use: Yes    Alcohol/week: 0.0 standard drinks    Comment: wine occassionally  . Drug use: No   Review of Systems Constitutional: Negative for fever. Cardiovascular: Negative for chest pain. Respiratory: Positive  for cough Gastrointestinal: Negative for vomiting and diarrhea. Skin: Negative for rash. Neurological: Positive for weakness and altered mental status  All systems negative/normal/unremarkable except as stated in the HPI  ____________________________________________   PHYSICAL EXAM:  VITAL SIGNS: ED Triage Vitals  Enc Vitals Group     BP 05/21/18 0922 132/76     Pulse Rate 05/21/18 0922 (!) 104     Resp 05/21/18 0922 (!) 22     Temp 05/21/18 0922 99.8 F (37.7 C)     Temp Source 05/21/18 0922 Axillary     SpO2 05/21/18 0922 95 %     Weight 05/21/18 0923 194 lb (88 kg)     Height --      Head Circumference --      Peak Flow --      Pain Score --      Pain Loc --      Pain Edu? --      Excl. in GC? --    Constitutional: Lethargic but responds to verbal stimuli ENT   Head: Normocephalic and atraumatic.   Nose: No congestion/rhinnorhea.   Mouth/Throat: Mucous membranes are moist.   Neck: No stridor. Cardiovascular: Normal rate, regular rhythm. No murmurs, rubs, or gallops. Respiratory: Normal respiratory effort without tachypnea nor retractions.  Scattered rhonchi are noted Gastrointestinal: Soft and nontender. Normal bowel sounds Musculoskeletal: Nontender with normal range of motion in extremities. No lower extremity tenderness nor edema. Neurologic:  Normal speech and language.  Generalized weakness,  nothing focal Skin:  Skin is warm, dry and intact. No rash noted. Psychiatric: Flat affect ____________________________________________  ED COURSE:  As part of my medical decision making, I reviewed the following data within the electronic MEDICAL RECORD NUMBER History obtained from family if available, nursing notes, old chart and ekg, as well as notes from prior ED visits. Patient presented for altered mental status and possible pneumonia, we will assess with labs and imaging as indicated at this time.   Procedures ____________________________________________    LABS (pertinent positives/negatives)  Labs Reviewed  CBC WITH DIFFERENTIAL/PLATELET - Abnormal; Notable for the following components:      Result Value   WBC 11.3 (*)    Neutro Abs 9.4 (*)    Lymphs Abs 0.5 (*)    Monocytes Absolute 1.2 (*)    All other components within normal limits  COMPREHENSIVE METABOLIC PANEL - Abnormal; Notable for the following components:   BUN 25 (*)    Total Bilirubin 1.3 (*)    All other components within normal limits  URINALYSIS, COMPLETE (UACMP) WITH MICROSCOPIC - Abnormal; Notable for the following components:   Color, Urine AMBER (*)    APPearance CLEAR (*)    Hgb urine dipstick SMALL (*)    Ketones, ur 5 (*)    All other components within normal limits  BLOOD GAS, VENOUS - Abnormal; Notable for the following components:   pCO2, Ven 43 (*)    All other components within normal limits  CULTURE, BLOOD (ROUTINE X 2)  CULTURE, BLOOD (ROUTINE X 2)  EXPECTORATED SPUTUM ASSESSMENT W REFEX TO RESP CULTURE  TROPONIN I  LACTIC ACID, PLASMA  GLUCOSE, CAPILLARY  CBG MONITORING, ED    RADIOLOGY Images were viewed by me  Chest x-ray CT head IMPRESSION: Chronic lung changes without evidence of acute cardiopulmonary Disease IMPRESSION: Mild atrophy with slight periventricular small vessel disease. No evident acute infarct. No mass or hemorrhage.  There are foci of arterial vascular calcification. There are foci of paranasal sinus disease.  ____________________________________________  DIFFERENTIAL DIAGNOSIS   Pneumonia, dehydration, electrolyte abnormality, sepsis, CVA, MI  FINAL ASSESSMENT AND PLAN  Altered mental status, possible pneumonia   Plan: The patient had presented for altered mental status. Patient's labs did reveal mild leukocytosis but were otherwise unremarkable. Patient's imaging was negative although he has had extensive cough while he was in the room examining him.  Wife states that this happened in the past with  pneumonia.  He has been given broad-spectrum antibiotics.  I will discuss with the hospitalist for admission.   Ulice DashJohnathan E Flay Ghosh, MD   Note: This note was generated in part or whole with voice recognition software. Voice recognition is usually quite accurate but there are transcription errors that can and very often do occur. I apologize for any typographical errors that were not detected and corrected.     Emily FilbertWilliams, Jung Ingerson E, MD 05/21/18 [phone]

## 2018-05-21 NOTE — ED Notes (Signed)
Patient transported to Ultrasound 

## 2018-05-21 NOTE — H&P (Signed)
[facility] - [place] at [facility]   PATIENT NAME: Bryan Berry    MR#:  [id number]  DATE OF BIRTH:  22-May-1946  DATE OF ADMISSION:  05/21/2018  PRIMARY CARE PHYSICIAN: Joaquim Namuncan, Graham S, MD   REQUESTING/REFERRING PHYSICIAN: dr Mayford Knifewilliams  CHIEF COMPLAINT:   cough HISTORY OF PRESENT ILLNESS:  Bryan ShellRobert Pazos  is a 72 y.o. male with a known history of dementia who presents from home due to altered mental status and cough.  Patient has dementia and I am unable to obtain HPI.  HPI is taken from ER physician and nurse at bedside.  Apparently over the past day patient has had altered mental status and is more confused than normal.  Normally he is awake and conversant and follows commands however today he is unable to do so.  He was very weak and so was brought to the ER for further evaluation.  In the emergency room he has a cough.  Chest x-ray does not show pneumonia however there is concern for pneumonia.  There is no fever or chills documented.  He was started on Zosyn and vancomycin.  PAST MEDICAL HISTORY:   Past Medical History:  Diagnosis Date  . Allergy   . Dementia (HCC)   . Memory loss   . UTI (lower urinary tract infection)    one episode    PAST SURGICAL HISTORY:   Past Surgical History:  Procedure Laterality Date  . CATARACT EXTRACTION W/ INTRAOCULAR LENS  IMPLANT, BILATERAL     feb and march 2019  . VARICOSE VEIN SURGERY      SOCIAL HISTORY:   Social History   Tobacco Use  . Smoking status: Former Games developermoker  . Smokeless tobacco: Never Used  . Tobacco comment: smoked a pipe, quit over 20 years ago  Substance Use Topics  . Alcohol use: Yes    Alcohol/week: 0.0 standard drinks    Comment: wine occassionally    FAMILY HISTORY:   Family History  Problem Relation Age of Onset  . Heart disease Father   . Colon cancer Neg Hx   . Prostate cancer Neg Hx     DRUG ALLERGIES:  No Known Allergies  REVIEW OF SYSTEMS:   Review of Systems  Unable  to perform ROS: Dementia    MEDICATIONS AT HOME:   Prior to Admission medications   Medication Sig Start Date End Date Taking? Authorizing Provider  donepezil (ARICEPT) 10 MG tablet TAKE 1 TABLET BY MOUTH DAILY IN THE MORNING Patient taking differently: Take 10 mg by mouth daily. TAKE 1 TABLET BY MOUTH DAILY IN THE MORNING 04/03/18  Yes Millikan, Aundra MilletMegan, NP  escitalopram (LEXAPRO) 10 MG tablet Take 1 tablet (10 mg total) by mouth at bedtime. 01/28/18  Yes Adhira Jamil, Patricia PesaSital, MD  fexofenadine (ALLEGRA) 180 MG tablet Take 180 mg by mouth daily as needed for allergies.    Yes [provider]  memantine (NAMENDA) 10 MG tablet Take 1 tablet (10 mg total) by mouth 2 (two) times daily. 07/03/17  Yes Butch PennyMillikan, Megan, NP  risperiDONE (RISPERDAL) 0.25 MG tablet Take 1 tablet (0.25 mg total) by mouth 2 (two) times daily. 01/28/18  Yes Adrian SaranMody, Adonai Helzer, MD  amoxicillin-clavulanate (AUGMENTIN) 875-125 MG tablet Take 1 tablet by mouth 2 (two) times daily. Patient not taking: Reported on 05/21/2018 01/28/18   Adrian SaranMody, Josey Dettmann, MD  benzonatate (TESSALON) 200 MG capsule Take 1 capsule (200 mg total) by mouth 3 (three) times daily as needed. Patient not taking: Reported on 05/21/2018 10/30/17  Joaquim Nam, MD  rivastigmine (EXELON) 4.6 mg/24hr Place 1 patch (4.6 mg total) onto the skin daily. Patient not taking: Reported on 05/21/2018 04/02/18   Butch Penny, NP      VITAL SIGNS:  Blood pressure 108/75, pulse 93, temperature 99.8 F (37.7 C), temperature source Axillary, resp. rate 15, weight 88 kg, SpO2 91 %.  PHYSICAL EXAMINATION:   Physical Exam  Constitutional: He appears well-developed and well-nourished. No distress.  HENT:  Head: Normocephalic.  Eyes: Pupils are equal, round, and reactive to light. No scleral icterus.  Neck: Neck supple. No JVD present. No tracheal deviation present.  Cardiovascular: Normal rate, regular rhythm and normal heart sounds. Exam reveals no gallop and no friction rub.  No  murmur heard. Pulmonary/Chest: Effort normal and breath sounds normal. No respiratory distress. He has no wheezes. He has no rales. He exhibits no tenderness.  Abdominal: Soft. Bowel sounds are normal. He exhibits no distension and no mass. There is no tenderness. There is no rebound and no guarding.  Musculoskeletal: Normal range of motion. He exhibits no edema.  Moves all extremities  Neurological:  Drowsy opens eyes but does not follow commands  Skin: Skin is warm. No rash noted. He is not diaphoretic. No erythema.  Psychiatric:  Dementia      LABORATORY PANEL:   CBC Recent Labs  Lab 05/21/18 0938  WBC 11.3*  HGB 13.6  HCT 42.0  PLT 186   ------------------------------------------------------------------------------------------------------------------  Chemistries  Recent Labs  Lab 05/21/18 0938  NA 136  K 3.9  CL 102  CO2 25  GLUCOSE 97  BUN 25*  CREATININE 0.96  CALCIUM 8.9  AST 21  ALT 19  ALKPHOS 104  BILITOT 1.3*   ------------------------------------------------------------------------------------------------------------------  Cardiac Enzymes Recent Labs  Lab 05/21/18 0938  TROPONINI <0.03   ------------------------------------------------------------------------------------------------------------------  RADIOLOGY:  Dg Chest 2 View  Result Date: 05/21/2018 CLINICAL DATA:  72 year old male with a history altered mental status EXAM: CHEST - 2 VIEW COMPARISON:  01/27/2018 FINDINGS: Cardiomediastinal silhouette is unchanged in size and contour. No evidence of central vascular congestion. No pneumothorax or pleural effusion. No confluent airspace disease. Similar appearance of coarsened interstitial markings. No displaced fracture.  Degenerative changes of the spine. IMPRESSION: Chronic lung changes without evidence of acute cardiopulmonary disease Electronically Signed   By: Gilmer Mor D.O.   On: 05/21/2018 10:32   Ct Head Wo Contrast  Result  Date: 05/21/2018 CLINICAL DATA:  Altered mental status with lack of responsiveness. Underlying dementia. EXAM: CT HEAD WITHOUT CONTRAST TECHNIQUE: Contiguous axial images were obtained from the base of the skull through the vertex without intravenous contrast. COMPARISON:  January 27, 2018 FINDINGS: Brain: There is mild diffuse atrophy. There is no intracranial mass, hemorrhage, extra-axial fluid collection, or midline shift. There is mild small vessel disease in the centra semiovale bilaterally, stable. No acute infarct evident. Vascular: No hyperdense vessel. There are foci of calcification in each carotid siphon region. Skull: Bony calvarium a appears intact. Sinuses/Orbits: There is opacification in several ethmoid air cells as well as in the lateral anterior right sphenoid sinus. Orbits appear symmetric bilaterally. Other: Mastoid air cells which are visualized are clear. IMPRESSION: Mild atrophy with slight periventricular small vessel disease. No evident acute infarct. No mass or hemorrhage. There are foci of arterial vascular calcification. There are foci of paranasal sinus disease. Electronically Signed   By: Bretta Bang III M.D.   On: 05/21/2018 10:26    EKG:   Orders placed or performed  during the hospital encounter of 01/27/18  . ED EKG  . ED EKG  . EKG 12-Lead  . EKG 12-Lead  . EKG    IMPRESSION AND PLAN:   72 year old male with history of dementia who presents due to altered mental status.  1.  Acute metabolic encephalopathy in the setting of underlying dementia: Metabolic encephalopathy due to pneumonia, likely aspiration.  2.  Aspiration pneumonia: Continue Unasyn with aspiration precautions Speech consult  3.  Dementia: Continue Risperdal, Aricept and Lexapro   PT consult once more alert and CM consult   All the records are reviewed and case discussed with ED provider.   CODE STATUS: Full  TOTAL TIME TAKING CARE OF THIS PATIENT: 43 minutes.    Lissete Maestas  M.D on 05/21/2018 at 11:45 AM  Between 7am to 6pm - Pager - [phone]  After 6pm go to [email or web address] - Social research officer, government  [facility]  Office  [phone]  CC: Primary care physician; Joaquim Nam, MD

## 2018-05-21 NOTE — ED Triage Notes (Signed)
Patient presents to ED via POV from home with wife due to altered mental status. Patient has a history of dementia but is normally awake and conversational and can follow commands. On arrival patient is attempting to fall out of wheelchair. Requires a sternal rub to arouse and will not follow commands. Dr Mayford KnifeWilliams at bedside.

## 2018-05-21 NOTE — Progress Notes (Signed)
SLP Cancellation Note  Patient Details Name: Bryan Berry MRN: [id number] DOB: 02/14/1946   Cancelled treatment:       Reason Eval/Treat Not Completed: Fatigue/lethargy limiting ability to participate;Patient not medically ready(chart reviewed; consulted NSG then met w/ Wife, pt).  Pt presented w/ declined mental status and alertness; lethargy. Wife stated he has been sleeping frequently but also agitated w/ congested coughing for ~2 days. D/t his reduced alertness and declined mental status currently, recommended NPO status w/ IV medications when necessary d/t increased risk for aspiration, dysphagia. Pt does have a baseline of Dementia.  ST services will f/u in the morning for BSE. NSG and wife agreed.     Orinda Kenner, Bruno, CCC-SLP Watson,Katherine 05/21/2018, 4:51 PM

## 2018-05-21 NOTE — ED Notes (Signed)
Nurse is unable to take report at this time  

## 2018-05-21 NOTE — Consult Note (Signed)
Pharmacy Antibiotic Note  Bryan Berry is a 72 y.o. male admitted on 05/21/2018 with aspiration pneumonia.  Patient presented with AMS. Pharmacy has been consulted for Unasyn dosing.  Plan: Discussed with physician to utilize Unasyn over Zosyn for aspiration pneumonia unless risk for pseudomonas, which was decided the patient is not at high risk for.   Ordered Unasyn 3 g IV q6h.  Weight: 194 lb (88 kg)  Temp (24hrs), Avg:99.8 F (37.7 C), Min:99.8 F (37.7 C), Max:99.8 F (37.7 C)  Recent Labs  Lab 05/21/18 0931 05/21/18 0938  WBC  --  11.3*  CREATININE  --  0.96  LATICACIDVEN 1.1  --     Estimated Creatinine Clearance: 73.9 mL/min (by C-G formula based on SCr of 0.96 mg/dL).    No Known Allergies  Antimicrobials this admission: 11/25 Vancomycin x 1 11/25 Zosyn x 1 11/25 Unasyn >>  Dose adjustments this admission: N/A  Microbiology results: 11/25 BCx: sent 11/25 Sputum: sent    Thank you for allowing pharmacy to be a part of this patient's care.   Mauri ReadingSavanna M Martin, PharmD Pharmacy Resident  05/21/2018 12:02 PM

## 2018-05-22 LAB — BASIC METABOLIC PANEL
Anion gap: 8 (ref 5–15)
BUN: 23 mg/dL (ref 8–23)
CO2: 25 mmol/L (ref 22–32)
Calcium: 8.2 mg/dL — ABNORMAL LOW (ref 8.9–10.3)
Chloride: 106 mmol/L (ref 98–111)
Creatinine, Ser: 1.01 mg/dL (ref 0.61–1.24)
GFR calc Af Amer: >60 mL/min (ref >60)
GLUCOSE: 87 mg/dL (ref 70–99)
Potassium: 3.6 mmol/L (ref 3.5–5.1)
SODIUM: 139 mmol/L (ref 135–145)

## 2018-05-22 LAB — CBC
HCT: 36.3 % — ABNORMAL LOW (ref 39.0–52.0)
HEMOGLOBIN: 12.1 g/dL — AB (ref 13.0–17.0)
MCH: 32.4 pg (ref 26.0–34.0)
MCHC: 33.3 g/dL (ref 30.0–36.0)
MCV: 97.1 fL (ref 80.0–100.0)
PLATELETS: 158 10*3/uL (ref 150–400)
RBC: 3.74 MIL/uL — ABNORMAL LOW (ref 4.22–5.81)
RDW: 13 % (ref 11.5–15.5)
WBC: 8.1 10*3/uL (ref 4.0–10.5)
nRBC: 0 % (ref 0.0–0.2)

## 2018-05-22 MED ORDER — POLYVINYL ALCOHOL 1.4 % OP SOLN
1.0000 [drp] | OPHTHALMIC | Status: DC | PRN
  Filled 2018-05-22: qty 15

## 2018-05-22 MED ORDER — AMOXICILLIN-POT CLAVULANATE 875-125 MG PO TABS
1.0000 | ORAL_TABLET | Freq: Two times a day (BID) | ORAL | Status: DC
  Administered 2018-05-22 – 2018-05-25 (×7): 1 via ORAL
  Filled 2018-05-22 (×7): qty 1

## 2018-05-22 MED ORDER — ERYTHROMYCIN 5 MG/GM OP OINT
TOPICAL_OINTMENT | Freq: Every day | OPHTHALMIC | Status: DC
  Administered 2018-05-22 – 2018-05-24 (×3): 1 via OPHTHALMIC
  Administered 2018-05-25: 22:00:00 via OPHTHALMIC
  Administered 2018-05-26 – 2018-05-27 (×2): 1 via OPHTHALMIC
  Filled 2018-05-22: qty 1

## 2018-05-22 NOTE — Progress Notes (Signed)
[facility] - [place] at [facility]   PATIENT NAME: Bryan ShellRobert Berry    MR#:  [id number]  DATE OF BIRTH:  Jan 19, 1946  SUBJECTIVE:  CHIEF COMPLAINT:   Patient is unresponsive/encephalopathic, wife at the bedside, noted difficulty with oral secretions, case discussed with care team as well as speech therapy, physical therapy to see REVIEW OF SYSTEMS:  CONSTITUTIONAL: No fever, fatigue or weakness.  EYES: No blurred or double vision.  EARS, NOSE, AND THROAT: No tinnitus or ear pain.  RESPIRATORY: No cough, shortness of breath, wheezing or hemoptysis.  CARDIOVASCULAR: No chest pain, orthopnea, edema.  GASTROINTESTINAL: No nausea, vomiting, diarrhea or abdominal pain.  GENITOURINARY: No dysuria, hematuria.  ENDOCRINE: No polyuria, nocturia,  HEMATOLOGY: No anemia, easy bruising or bleeding SKIN: No rash or lesion. MUSCULOSKELETAL: No joint pain or arthritis.   NEUROLOGIC: No tingling, numbness, weakness.  PSYCHIATRY: No anxiety or depression.   ROS  DRUG ALLERGIES:  No Known Allergies  VITALS:  Blood pressure 118/72, pulse 92, temperature 100.3 F (37.9 C), temperature source Axillary, resp. rate 20, weight 88 kg, SpO2 95 %.  PHYSICAL EXAMINATION:  GENERAL:  72 y.o.-year-old patient lying in the bed with no acute distress.  EYES: Pupils equal, round, reactive to light and accommodation. No scleral icterus. Extraocular muscles intact.  HEENT: Head atraumatic, normocephalic. Oropharynx and nasopharynx clear.  NECK:  Supple, no jugular venous distention. No thyroid enlargement, no tenderness.  LUNGS: Normal breath sounds bilaterally, no wheezing, rales,rhonchi or crepitation. No use of accessory muscles of respiration.  CARDIOVASCULAR: S1, S2 normal. No murmurs, rubs, or gallops.  ABDOMEN: Soft, nontender, nondistended. Bowel sounds present. No organomegaly or mass.  EXTREMITIES: No pedal edema, cyanosis, or clubbing.  NEUROLOGIC: Cranial nerves II through XII are  intact. Muscle strength 5/5 in all extremities. Sensation intact. Gait not checked.  PSYCHIATRIC: The patient is alert and oriented x 3.  SKIN: No obvious rash, lesion, or ulcer.   Physical Exam LABORATORY PANEL:   CBC Recent Labs  Lab 05/22/18 0416  WBC 8.1  HGB 12.1*  HCT 36.3*  PLT 158   ------------------------------------------------------------------------------------------------------------------  Chemistries  Recent Labs  Lab 05/21/18 0938 05/22/18 0416  NA 136 139  K 3.9 3.6  CL 102 106  CO2 25 25  GLUCOSE 97 87  BUN 25* 23  CREATININE 0.96 1.01  CALCIUM 8.9 8.2*  AST 21  --   ALT 19  --   ALKPHOS 104  --   BILITOT 1.3*  --    ------------------------------------------------------------------------------------------------------------------  Cardiac Enzymes Recent Labs  Lab 05/21/18 0938  TROPONINI <0.03   ------------------------------------------------------------------------------------------------------------------  RADIOLOGY:  Dg Chest 2 View  Result Date: 05/21/2018 CLINICAL DATA:  72 year old male with a history altered mental status EXAM: CHEST - 2 VIEW COMPARISON:  01/27/2018 FINDINGS: Cardiomediastinal silhouette is unchanged in size and contour. No evidence of central vascular congestion. No pneumothorax or pleural effusion. No confluent airspace disease. Similar appearance of coarsened interstitial markings. No displaced fracture.  Degenerative changes of the spine. IMPRESSION: Chronic lung changes without evidence of acute cardiopulmonary disease Electronically Signed   By: Gilmer MorJaime  Wagner D.O.   On: 05/21/2018 10:32   Ct Head Wo Contrast  Result Date: 05/21/2018 CLINICAL DATA:  Altered mental status with lack of responsiveness. Underlying dementia. EXAM: CT HEAD WITHOUT CONTRAST TECHNIQUE: Contiguous axial images were obtained from the base of the skull through the vertex without intravenous contrast. COMPARISON:  January 27, 2018 FINDINGS:  Brain: There is mild diffuse atrophy. There is no  intracranial mass, hemorrhage, extra-axial fluid collection, or midline shift. There is mild small vessel disease in the centra semiovale bilaterally, stable. No acute infarct evident. Vascular: No hyperdense vessel. There are foci of calcification in each carotid siphon region. Skull: Bony calvarium a appears intact. Sinuses/Orbits: There is opacification in several ethmoid air cells as well as in the lateral anterior right sphenoid sinus. Orbits appear symmetric bilaterally. Other: Mastoid air cells which are visualized are clear. IMPRESSION: Mild atrophy with slight periventricular small vessel disease. No evident acute infarct. No mass or hemorrhage. There are foci of arterial vascular calcification. There are foci of paranasal sinus disease. Electronically Signed   By: Bretta Bang III M.D.   On: 05/21/2018 10:26   US Venous Img Lower Unilateral Left  Result Date: 05/21/2018 CLINICAL DATA:  Edema EXAM: LEFT LOWER EXTREMITY VENOUS DUPLEX ULTRASOUND TECHNIQUE: Doppler venous assessment of the left lower extremity deep venous system was performed, including characterization of spectral flow, compressibility, and phasicity. COMPARISON:  None. FINDINGS: The exam was limited due to noncompliance. The patient refused finishing the exam. There is complete compressibility of the left common femoral and femoral veins. The popliteal vein was not evaluated. Doppler analysis of the deep venous system was not performed Evaluation of the superficial venous system was not performed. IMPRESSION: Limited examination as described above. No evidence of DVT in the left lower extremity. Please note that this is an incomplete examination. Electronically Signed   By: Jolaine Click M.D.   On: 05/21/2018 13:35    ASSESSMENT AND PLAN:  72 year old male with history of dementia who presents due to altered mental status.  * Acute metabolic encephalopathy Most likely  represents worsening chronic dementia Neurochecks per routine, avoid psychotropic meds, aspiration/fall/skin care precautions while in house, speech therapy recommending pured diet with nectar thickened liquids, physical therapy to see  *Chronic dementia Most likely worsening noted Increase nursing care PRN, all other plans as stated above  *Acute suspected aspiration pneumonia Chest x-ray and laboratory investigations unimpressive Aspiration precautions while in house, short course of Augmentin/Flagyl, and continue close medical monitoring, suctioning as needed  Disposition Home with home health services in 1 to 2 days barring any complications   All the records are reviewed and case discussed with Care Management/Social Workerr. Management plans discussed with the patient, family and they are in agreement.  CODE STATUS: full  TOTAL TIME TAKING CARE OF THIS PATIENT: 40 minutes.     POSSIBLE D/C IN 1-2 DAYS, DEPENDING ON CLINICAL CONDITION.   Evelena Asa Salary M.D on 05/22/2018   Between 7am to 6pm - Pager - [phone]  After 6pm go to [email or web address] - password [email or web address] [facility]  [facility]  Office  [phone]  CC: Primary care physician; Joaquim Nam, MD  Note: This dictation was prepared with Dragon dictation along with smaller phrase technology. Any transcriptional errors that result from this process are unintentional.

## 2018-05-22 NOTE — Evaluation (Signed)
Physical Therapy Evaluation Patient Details Name: Bryan ShellRobert Berry MRN: [id number] DOB: 01-May-1946 Today's Date: 05/22/2018   History of Present Illness   72 y.o. male with a history of allergies, dementia, UTI, aspiration pneumonia who presents to the ED for altered mental status.  According to the wife he has history of dementia but is normally awake and conversant and following commands.  Is having difficulty following commands and is harder to arouse.  She reports he was this way in the past when he had pneumonia.  He has had a noted cough over the last 24 hours.  Clinical Impression  Patient lethargic at start of session, but nursing/family reporting he was more awake previously and attempting to mobilize. Pt unable to open eyes on command. PT had spoken with wife previously to obtain PLOF, at baseline patient ambulates without AD with supervision, and alternates living at own home, and son's home. Needs assistance with ADLs, dependent for IADLs at baseline.  Supine to sit transfer performed maxAx1 to assess patient response, able to progress from maxAx1 to maintain balance at EOB to CGA/supervision. Pt confused, but able to maintain sitting 4-5 minutes. Sit <> stand performed with modAx2 and RW, modAx1 and bracing of LE against bed to maintain standing position. Pt returned to supine due to increased impulsivity/confusion with maxAx2. Overall the patient demonstrated deficits (see "PT Problem List") that limit patient's ability to perform functional activities as well as deficits compared to PLOF and would benefit from skilled PT intervention. Recommendation is STR due to current level of assistance needed for mobility and mental status.     Follow Up Recommendations SNF    Equipment Recommendations  Other (comment)(TBD)    Recommendations for Other Services       Precautions / Restrictions Precautions Precautions: Fall Restrictions Weight Bearing Restrictions: No      Mobility  Bed  Mobility Overal bed mobility: Needs Assistance Bed Mobility: Supine to Sit     Supine to sit: Max assist;+2 for physical assistance;HOB elevated;Supervision     General bed mobility comments: Supine to sit performed maxAx1, able to sit EOB 4-365mins progressing from maxAx1-CGA/supervision. Patient sit to supine maxAx2, and then patient supine to sit with supervision due to confusion. Agitated, impulsive, return to supine x2 due to confusion.  Transfers Overall transfer level: Needs assistance   Transfers: Sit to/from Stand Sit to Stand: Mod assist         General transfer comment: Pt pulling heavily on RW, modAx1 to maintain standing balance, legs braced on bed.  Ambulation/Gait             General Gait Details: deferred due to safety concerns  Stairs            Wheelchair Mobility    Modified Rankin (Stroke Patients Only)       Balance Overall balance assessment: Needs assistance Sitting-balance support: Feet supported Sitting balance-Leahy Scale: Fair       Standing balance-Leahy Scale: Poor                               Pertinent Vitals/Pain Pain Assessment: Faces Faces Pain Scale: No hurt    Home Living Family/patient expects to be discharged to:: Private residence Living Arrangements: Spouse/significant other Available Help at Discharge: Family Type of Home: House Home Access: Level entry     Home Layout: One level Home Equipment: Grab bars - tub/shower Additional Comments: Per wife, wife and pt spend  time at son's house 2-3x a week as well. 2 story home, 6 STE with R and L rails, walk in shower. Pt may benefit from shower chair, bedside commode.    Prior Function Level of Independence: Needs assistance   Gait / Transfers Assistance Needed: Pt ambulates in home with supervision from wife, No AD  ADL's / Homemaking Assistance Needed: wife/son performs IADLs. ADLs patient performs as much as possible, wife assists.         Hand Dominance        Extremity/Trunk Assessment   Upper Extremity Assessment Upper Extremity Assessment: Difficult to assess due to impaired cognition    Lower Extremity Assessment Lower Extremity Assessment: Difficult to assess due to impaired cognition       Communication   Communication: Other (comment)(confusion)  Cognition Arousal/Alertness: Lethargic Behavior During Therapy: Agitated;Restless;Impulsive Overall Cognitive Status: Impaired/Different from baseline Area of Impairment: Orientation;Attention;Following commands;Safety/judgement;Awareness;Problem solving                 Orientation Level: Disoriented to Current Attention Level: Alternating   Following Commands: Follows one step commands inconsistently;Follows multi-step commands inconsistently Safety/Judgement: Decreased awareness of safety;Decreased awareness of deficits            General Comments      Exercises     Assessment/Plan    PT Assessment Patient needs continued PT services  PT Problem List Decreased strength;Decreased cognition;Decreased range of motion;Decreased knowledge of use of DME;Decreased activity tolerance;Decreased safety awareness;Decreased balance;Decreased mobility       PT Treatment Interventions DME instruction;Balance training;Gait training;Neuromuscular re-education;Stair training;Functional mobility training;Patient/family education;Therapeutic activities;Therapeutic exercise    PT Goals (Current goals can be found in the Care Plan section)  Acute Rehab PT Goals Patient Stated Goal: Would like patient to gain back strength/mobility PT Goal Formulation: With family Time For Goal Achievement: 06/05/18 Potential to Achieve Goals: Fair    Frequency Min 2X/week   Barriers to discharge        Co-evaluation               AM-PAC PT "6 Clicks" Mobility  Outcome Measure Help needed turning from your back to your side while in a flat bed without  using bedrails?: A Lot Help needed moving from lying on your back to sitting on the side of a flat bed without using bedrails?: A Lot Help needed moving to and from a bed to a chair (including a wheelchair)?: Total Help needed standing up from a chair using your arms (e.g., wheelchair or bedside chair)?: A Lot Help needed to walk in hospital room?: Total Help needed climbing 3-5 steps with a railing? : Total 6 Click Score: 9    End of Session Equipment Utilized During Treatment: Gait belt Activity Tolerance: Treatment limited secondary to agitation Patient left: in bed;with call bell/phone within reach;with bed alarm set;with family/visitor present Nurse Communication: Mobility status PT Visit Diagnosis: Unsteadiness on feet (R26.81);Other abnormalities of gait and mobility (R26.89);Muscle weakness (generalized) (M62.81);Difficulty in walking, not elsewhere classified (R26.2)    Time: 1610-9604 PT Time Calculation (min) (ACUTE ONLY): 23 min   Charges:   PT Evaluation $PT Eval Low Complexity: 1 Low PT Treatments $Therapeutic Activity: 8-22 mins        Olga Coaster PT, DPT 3:52 PM,05/22/18 [phone]

## 2018-05-22 NOTE — Progress Notes (Signed)
PT Cancellation Note  Patient Details Name: Bryan Berry MRN: [id number] DOB: 10/03/1945   Cancelled Treatment:    Reason Eval/Treat Not Completed: Fatigue/lethargy limiting ability to participate;Other (comment)(Pt only able to very minimally follow commands, lethargic. PT will follow up as able when pt is more appropriate.)  Olga Coasteriana Michale Weikel PT, DPT 12:38 PM,05/22/18 [phone]

## 2018-05-22 NOTE — Care Management Note (Addendum)
Case Management Note  Patient Details  Name: Barrett ShellRobert Butikofer MRN: [id number] Date of Birth: August 16, 1945  Subjective/Objective:                 Patient brought to ED due to altered mental status. He has advanced dementia but per wife,  at baseline, is able to ambulate independently but to prevent falls "is watched" when he is ambulating at home and follows instruction. His wife is primary caregiver and resides in [place].  PCP is Crawford GivensGraham Duncan in [place] and neurologist is Dr Anne HahnWillis in [place].  Prefers to seek medical care and hospitalization outside of [place] and [place].  [place] - [address] [place] [address].  To help relieve his mother, patient [place] [facility] [place] then returns to [place].  [facility] provides in home aides two days a week from 12-5pm so wife can get out and run errands.  There are alarms on the doors and an  alarm on the bed to alert wife when patient is getting up in the middle of the night.  Patient and wife have a long term care policy but at present she is paying out of pocket for the aides.  Patient is full code.  Admitted with sx concerning for pneumonia. SLP has placed patient on Dysphagia level 1 with nectar.  This will be new for the patient.  Currently he is sleeping soundly.     CM discussed outpatient palliative for goals and symptom management in the event current state becomes a new baseline.  Agreeable. Patient was not able to participate with physical  therapy this morning and when returned this afternoon, patient was only able to sit on the side of the bed and max assist to stand.  This is not patient's baseline. Current recommendation is for skilled nursing placement.    If this recommendation changes or family decline facility placement, agreeable to home health.  There are complications setting this up due to patient being between two residences.   [place] declined case, even  though they have agencies in [place] and [place].  Says there are issues with crossing state line, different billing issues with different NPI numbers.      [facility]  in [place] accepted  home health referral  [phone]) [phone]  Fax [phone].  Sarah at [facility] says that she may be able to have [place] palliative follow for outpatient palliative. Per patient's son- [place] be patient's base.  CM spoke with wife and discussed considering increasing her in home care hours and then she relayed reason for going [place] [facility] was not just  for her to have assistance with patient.  She helps her son with the grandchildren.  She says she will consider SNF if it is needed.  Otherwise, should discharge home in [place] and receive home health through [facility] (? RN PT, Aide) .  It is hoped that [facility] will be able to provide the outpatient palliative as this patient and family will probably need some assistance with goal setting, sx management and code status.  Action/Plan:  Updated [facility] of possible change in the outpatient palliative referral  Expected Discharge Date:  05/25/18               Expected Discharge Plan:     In-House Referral:     Discharge planning Services     Post Acute Care Choice:  Choice offered to:     DME Arranged:    DME Agency:     HH Arranged:    HH Agency:     Status of Service:     If discussed at [employer], dates discussed:    Additional Comments:  Eber Hong, RN 05/22/2018, 4:43 PM

## 2018-05-22 NOTE — Progress Notes (Signed)
New referral for outpatient Palliative to follow at home received from [facility]. [place] Nann [place] his [place] at: [address] [place] [address]. Plan is for discharge with home health, no agency identified at this time. Patient information faxed to referral. Dayna BarkerKaren Robertson RN, BSN, [facility] [place], hospital Liaison [phone]

## 2018-05-22 NOTE — Evaluation (Signed)
Clinical/Bedside Swallow Evaluation Patient Details  Name: Bryan ShellRobert Encinas MRN: [id number] Date of Birth: 11/14/1945  Today's Date: 05/22/2018 Time: SLP Start Time (ACUTE ONLY): 0915 SLP Stop Time (ACUTE ONLY): 1015 SLP Time Calculation (min) (ACUTE ONLY): 60 min  Past Medical History:  Past Medical History:  Diagnosis Date  . Allergy   . Dementia (HCC)   . Memory loss   . UTI (lower urinary tract infection)    one episode   Past Surgical History:  Past Surgical History:  Procedure Laterality Date  . CATARACT EXTRACTION W/ INTRAOCULAR LENS  IMPLANT, BILATERAL     feb and march 2019  . VARICOSE VEIN SURGERY     HPI:  Pt is a 72 y.o. male with a known history of advanced Dementia who presents from home due to altered mental status and cough.  Patient has Dementia.  HPI is taken from ER physician and nurse at bedside, Wife.  Apparently over the past day, patient has had altered mental status and is more confused than normal.  Normally he is awake and conversant and follows some commands, however today, he is unable to do so.  He was very weak and so was brought to the ER for further evaluation.  In the emergency room he has a congested cough.  Chest x-ray does not show pneumonia however there is concern for pneumonia.  There is no fever or chills documented.  He was started on Zosyn and vancomycin.  This morning, pt is more awake though keeps eyes closed.  He responds w/ few mumbled words w/ low volume but does respond appropriately w/ opening mouth when given Mod tactile/verbal cues.  Noted OM tremors.    Assessment / Plan / Recommendation Clinical Impression  Pt appears to present w/ Min-Mod oropharyngeal phase swallowing dysfunction w/ trials given - Nectar liquids, purees, ice chips. Pt has significant confusion secondary to advanced Dementia and is primarily nonverbal. He did respond w/ few mumbled words when given Mod verbal cues(called his name). Pt required Mod verbal/tactile cues,  and he orally accepted bites and sips. No immediate, overt s/s of aspiration noted; no decline in respiratory effort during/post trials w/ trials of Nectar liquids and purees; Mild Throat Clearing noted w/ trials of ice chips. Unable to assess vocal quality consistently secondary to Cognitive status. Oral phase appeared impacted by his Cognitive decline but also noted Mild tremorous activity during oral acceptance of boluses and bolus management, A-P transfer. Pt exhibited min increased mastication/munching of increased textured trials, ice chips. He cleared orally w/ all trials given time for the increased time and lingual movements/sweeping at times. Pt was impulsive during drinking of Nectar liquids via Straw requiring Pinching of straw to control bolus amounts and slow the sipping. Pt required full feeding assistance d/t Cognitive decline. OM exam revealed no unilateral weakness w/ bolus management; min decreased awareness. Recommend a dysphagia level 1 (PUREE) w/ Nectar liquids; aspiration precautions; full supervision at all meals/po's; assistance feeding to Pinch straw to limit boluses; recommend dietitican f/u for nutritional supplement. Pt may be moving toward a new baseline in light of his advanced Dementia. Recommend this diet w/ precautions at baseline for safer oral intake. NSG updated.  SLP Visit Diagnosis: Dysphagia, oropharyngeal phase (R13.12)(impacted by Cognitive status)    Aspiration Risk  Mild aspiration risk(reduced w/ aspiration precautions)    Diet Recommendation  Dysphagia level 1 (PUREE) w/ NECTAR liquids; aspiration precautions; feeding support and Supervision at meals to Pinch straw and monitor bolus size/amount  Medication  Administration: Crushed with puree(for safer swallowing)    Other  Recommendations Recommended Consults: (Dietician f/u; Palliative Care consult for GOC) Oral Care Recommendations: Oral care BID;Staff/trained caregiver to provide oral care Other  Recommendations: Order thickener from pharmacy;Prohibited food (jello, ice cream, thin soups);Remove water pitcher;Have oral suction available   Follow up Recommendations (TBD)      Frequency and Duration min 3x week  2 weeks       Prognosis Prognosis for Safe Diet Advancement: Fair Barriers to Reach Goals: Cognitive deficits;Time post onset;Severity of deficits      Swallow Study   General Date of Onset: 05/21/18 HPI: Pt is a 72 y.o. male with a known history of advanced Dementia who presents from home due to altered mental status and cough.  Patient has Dementia.  HPI is taken from ER physician and nurse at bedside, Wife.  Apparently over the past day, patient has had altered mental status and is more confused than normal.  Normally he is awake and conversant and follows some commands, however today, he is unable to do so.  He was very weak and so was brought to the ER for further evaluation.  In the emergency room he has a congested cough.  Chest x-ray does not show pneumonia however there is concern for pneumonia.  There is no fever or chills documented.  He was started on Zosyn and vancomycin.  This morning, pt is more awake though keeps eyes closed.  He responds w/ few mumbled words w/ low volume but does respond appropriately w/ opening mouth when given Mod tactile/verbal cues.  Noted OM tremors.  Type of Study: Bedside Swallow Evaluation Previous Swallow Assessment: none reported Diet Prior to this Study: Dysphagia 3 (soft);Thin liquids(w/ well-chopped foods per Wife) Temperature Spikes Noted: (wbc 8.1; temp 100.3) Respiratory Status: Room air History of Recent Intubation: No Behavior/Cognition: Cooperative;Pleasant mood;Confused;Distractible;Requires cueing;Doesn't follow directions(Awake to follow through when given Mod cues) Oral Cavity Assessment: Dry(sticky) Oral Care Completed by SLP: Recent completion by staff Oral Cavity - Dentition: Adequate natural dentition Vision:  (n/a) Self-Feeding Abilities: Total assist Patient Positioning: Upright in bed(needed positioning) Baseline Vocal Quality: Low vocal intensity(mumbled speech intermittently) Volitional Cough: Cognitively unable to elicit Volitional Swallow: Unable to elicit    Oral/Motor/Sensory Function Overall Oral Motor/Sensory Function: (unable to complete d/t Cognitive status; appeared [facility])   Ice Chips Ice chips: Impaired Presentation: Spoon(fed; 4 trials) Oral Phase Impairments: Poor awareness of bolus;Reduced lingual movement/coordination(min) Oral Phase Functional Implications: Oral holding;Prolonged oral transit(min) Pharyngeal Phase Impairments: Throat Clearing - Delayed(x1)   Thin Liquid Thin Liquid: Not tested    Nectar Thick Nectar Thick Liquid: Within functional limits Presentation: Straw(fed; 4 ozs) Other Comments: Impulsive drinking - needed straw pinched to monitor bolus amounts   Honey Thick Honey Thick Liquid: Not tested   Puree Puree: Impaired Presentation: Spoon(fed; 8-9 trials) Oral Phase Impairments: Reduced lingual movement/coordination;Poor awareness of bolus(munched on the boluses ) Oral Phase Functional Implications: Prolonged oral transit Pharyngeal Phase Impairments: (none)   Solid     Solid: Not tested Other Comments: d/t Cognitive status       Jerilynn Som, MS, CCC-SLP Watson,Katherine 05/22/2018,11:27 AM

## 2018-05-22 NOTE — Progress Notes (Signed)
New referral for outpatient Palliative to follow at home received from [facility]. [place] lives in [place], but spends 3 days a week with [place] in [place]. Awaiting home health agency information. Patient information faxed to referral. Dayna BarkerKaren Robertson RN, BSN, [facility] [place], hospital Liaison [phone]

## 2018-05-23 NOTE — Plan of Care (Signed)
Pt remains disoriented; Wife is progressing in understanding of disease process, interventions and plan if care for the pt.

## 2018-05-23 NOTE — Clinical Social Work Note (Signed)
Clinical Social Work Assessment  Patient Details  Name: Bryan Berry MRN: [id number] Date of Birth: 1946/06/05  Date of referral:  05/23/18               Reason for consult:  Facility Placement                Permission sought to share information with:  Chartered certified accountant granted to share information::  Yes, Verbal Permission Granted  Name::      [place]::   [place]   Relationship::     Contact Information:     Housing/Transportation Living arrangements for the past 2 months:  Single Family Home Source of Information:  Spouse Patient Interpreter Needed:  None Criminal Activity/Legal Involvement Pertinent to Current Situation/Hospitalization:  No - Comment as needed Significant Relationships:  Adult Children, Spouse Lives with:  Spouse Do you feel safe going back to the place where you live?  Yes Need for family participation in patient care:  Yes (Comment)  Care giving concerns:  Patient lives in [place], [place] with his wife Bryan Berry [phone].    Social Worker assessment / plan:  Holiday representative (CSW) reviewed chart and noted that PT is recommending SNF. CSW met with patient and his wife Bryan Berry was at bedside. Patient was asleep and did not participate in assessment. CSW introduced self and explained role of CSW department. Per wife they live in [place], [place] and are staying with their son Bryan Berry [phone] and his wife in [place], [place]. Per Bryan Berry, patient's wife they like the doctors in [place], [place] better then [place], [place]. [place] explained SNF process and that medicare requires a 3 night qualifying inpatient stay in a hospital in order to pay for SNF. Patient was admitted to inpatient 05/21/18. Wife is agreeable to SNF search in [place] and prefers [place]. FL2 complete and faxed out. CSW will continue to follow and assist as needed.   Employment status:  Retired Forensic scientist:  Medicare PT  Recommendations:  [place] / Referral to community resources:  [place]  Patient/Family's Response to care:  Patient's wife is agreeable to [facility] in [place].   Patient/Family's Understanding of and Emotional Response to Diagnosis, Current Treatment, and Prognosis:  Patient's wife was very pleasant and thanked CSW for assistance.   Emotional Assessment Appearance:  Appears stated age Attitude/Demeanor/Rapport:  Unable to Assess Affect (typically observed):  Unable to Assess Orientation:  Oriented to Self, Fluctuating Orientation (Suspected and/or reported Sundowners) Alcohol / Substance use:  Not Applicable Psych involvement (Current and /or in the community):  No (Comment)  Discharge Needs  Concerns to be addressed:  Discharge Planning Concerns Readmission within the last 30 days:  No Current discharge risk:  Dependent with Mobility Barriers to Discharge:  Continued Medical Work up   [employer], Veronia Beets, LCSW 05/23/2018, 6:04 PM

## 2018-05-23 NOTE — Progress Notes (Signed)
Physical Therapy Treatment Patient Details Name: Bryan ShellRobert Hildebrandt MRN: [id number] DOB: 1946/03/04 Today's Date: 05/23/2018    History of Present Illness  72 y.o. male with a history of allergies, dementia, UTI, aspiration pneumonia who presents to the ED for altered mental status.  According to the wife he has history of dementia but is normally awake and conversant and following commands.  Is having difficulty following commands and is harder to arouse.  She reports he was this way in the past when he had pneumonia.  He has had a noted cough over the last 24 hours.    PT Comments    Initially pt laying in bed with eyes closed but woke with cueing and activity.  In sitting pt initially with R lean with head rotated to R but improved sitting posture and head positioning noted during session's activities.  Pt requiring 2 assist to stand from bed and from recliner (decreased assist levels noted with repetition but still requiring 2 assist).  Pt with posterior lean throughout therapy session (pt with increased WB'ing through heels and toes coming off floor).  During standing activities, pt incontinent of bowel requiring clean-up (pt assisted back to bed for clean-up).  Impulsiveness noted.  Will continue to progress pt with progressive functional mobility per pt tolerance.    Follow Up Recommendations  SNF     Equipment Recommendations  Rolling walker with 5" wheels    Recommendations for Other Services       Precautions / Restrictions Precautions Precautions: Fall Restrictions Weight Bearing Restrictions: No    Mobility  Bed Mobility Overal bed mobility: Needs Assistance Bed Mobility: Supine to Sit;Sit to Supine  Mod to max assist for logrolling L and R in bed for clean-up.   Supine to sit: Mod assist;Max assist;HOB elevated Sit to supine: Mod assist;+2 for physical assistance;HOB elevated   General bed mobility comments: assist for trunk and B LE's; assist to initiate movement to  get OOB; 2 assist to get back to bed d/t incontinence of bowel  Transfers Overall transfer level: Needs assistance Equipment used: 2 person hand held assist Transfers: Sit to/from Stand;Stand Pivot Transfers Sit to Stand: Min assist;Mod assist;+2 physical assistance Stand pivot transfers: Min assist;Mod assist;+2 physical assistance       General transfer comment: mod assist x2 to stand from bed initially (x2 trials); min to mod assist x2 to stand from recliner 1st trial and min assist x2 to stand 2nd trial; posterior lean noted in standing  Ambulation/Gait Ambulation/Gait assistance: Min assist;Mod assist;+2 physical assistance Gait Distance (Feet): 2 Feet Assistive device: 2 person hand held assist   Gait velocity: decreased   General Gait Details: decreased B step length/foot clearance/heelstrike; narrow BOS; posterior lean (WB'ing more through heels than forefoot)   Stairs             Wheelchair Mobility    Modified Rankin (Stroke Patients Only)       Balance Overall balance assessment: Needs assistance Sitting-balance support: Bilateral upper extremity supported;Feet supported Sitting balance-Leahy Scale: Poor Sitting balance - Comments: pt initially requiring mod assist for sitting balance (R lean noted with head rotated to R) but improved to close SBA with head facing forward   Standing balance support: Bilateral upper extremity supported Standing balance-Leahy Scale: Poor Standing balance comment: posterior lean noted in standing (weight through heels and toes coming off floor)  Cognition Arousal/Alertness: (Initially lethargic but woke with cueing and activity) Behavior During Therapy: Impulsive;Flat affect Overall Cognitive Status: Impaired/Different from baseline Area of Impairment: Orientation;Attention;Following commands;Safety/judgement;Awareness;Problem solving                 Orientation Level: (pt  unable to state name or DOB when asked) Current Attention Level: Alternating   Following Commands: Follows one step commands consistently;Follows multi-step commands inconsistently Safety/Judgement: Decreased awareness of safety;Decreased awareness of deficits Awareness: Anticipatory Problem Solving: Slow processing;Decreased initiation;Difficulty sequencing;Requires verbal cues;Requires tactile cues        Exercises      General Comments  Nursing contacted PT this afternoon to see pt today.      Pertinent Vitals/Pain Pain Assessment: Faces Faces Pain Scale: Hurts a little bit Pain Location: with turning head to the left Pain Descriptors / Indicators: Guarding Pain Intervention(s): Limited activity within patient's tolerance;Monitored during session;Repositioned    Home Living                      Prior Function            PT Goals (current goals can now be found in the care plan section) Acute Rehab PT Goals Patient Stated Goal: Would like patient to gain back strength/mobility PT Goal Formulation: With family Time For Goal Achievement: 06/05/18 Potential to Achieve Goals: Fair Progress towards PT goals: Progressing toward goals    Frequency    Min 2X/week      PT Plan Current plan remains appropriate    Co-evaluation              AM-PAC PT "6 Clicks" Mobility   Outcome Measure  Help needed turning from your back to your side while in a flat bed without using bedrails?: A Lot Help needed moving from lying on your back to sitting on the side of a flat bed without using bedrails?: A Lot Help needed moving to and from a bed to a chair (including a wheelchair)?: Total Help needed standing up from a chair using your arms (e.g., wheelchair or bedside chair)?: Total Help needed to walk in hospital room?: Total Help needed climbing 3-5 steps with a railing? : Total 6 Click Score: 8    End of Session Equipment Utilized During Treatment: Gait  belt Activity Tolerance: Patient tolerated treatment well Patient left: in bed;with nursing/sitter in room;Other (comment)(Nurse and nursing tech present assisting pt with clean-up) Nurse Communication: Mobility status;Precautions;Other (comment)(Nurse present entire session assisting) PT Visit Diagnosis: Unsteadiness on feet (R26.81);Other abnormalities of gait and mobility (R26.89);Muscle weakness (generalized) (M62.81);Difficulty in walking, not elsewhere classified (R26.2)     Time: 1610-9604 PT Time Calculation (min) (ACUTE ONLY): 44 min  Charges:  $Therapeutic Activity: 38-52 mins                    Hendricks Limes, PT 05/23/18, 4:16 PM [phone]

## 2018-05-23 NOTE — Care Management Important Message (Signed)
Important Message  Patient Details  Name: Bryan ShellRobert Berry MRN: [id number] Date of Birth: July 21, 1945   Medicare Important Message Given:  Yes    Olegario MessierKathy A Trigg Delarocha 05/23/2018, 11:47 AM

## 2018-05-23 NOTE — Clinical Social Work Placement (Signed)
   CLINICAL SOCIAL WORK PLACEMENT  NOTE  Date:  05/23/2018  Patient Details  Name: Bryan Berry MRN: [id number] Date of Birth: 1945/07/14  Clinical Social Work is seeking post-discharge placement for this patient at the Skilled  Nursing Facility level of care (*CSW will initial, date and re-position this form in  chart as items are completed):  Yes   Patient/family provided with [facility] Clinical Social Work Department's list of facilities offering this level of care within the geographic area requested by the patient (or if unable, by the patient's family).  Yes   Patient/family informed of their freedom to choose among providers that offer the needed level of care, that participate in Medicare, Medicaid or managed care program needed by the patient, have an available bed and are willing to accept the patient.  Yes   Patient/family informed of [facility]'s ownership interest in [facility] and [facility], as well as of the fact that they are under no obligation to receive care at these facilities.  PASRR submitted to EDS on 05/23/18     PASRR number received on 05/23/18     Existing PASRR number confirmed on       FL2 transmitted to all facilities in geographic area requested by pt/family on 05/23/18     FL2 transmitted to all facilities within larger geographic area on       Patient informed that his/her managed care company has contracts with or will negotiate with certain facilities, including the following:        Yes   Patient/family informed of bed offers received.  Patient chooses bed at       Physician recommends and patient chooses bed at      Patient to be transferred to   on  .  Patient to be transferred to facility by       Patient family notified on   of transfer.  Name of family member notified:        PHYSICIAN       Additional Comment:    _______________________________________________ Abby Stines, Darleen CrockerBailey M, LCSW 05/23/2018, 6:03 PM

## 2018-05-23 NOTE — NC FL2 (Signed)
[place] MEDICAID FL2 LEVEL OF CARE SCREENING TOOL     IDENTIFICATION  Patient Name: Bryan Berry Birthdate: 04/28/1946 Sex: male Admission Date (Current Location): 05/21/2018  [place] and [place] Number:  Chiropodist and Address:  [facility], [address] [place], [place] [address]      Provider Number: [id number]  Attending Physician Name and Address:  Houston Siren, MD  Relative Name and Phone Number:       Current Level of Care: Hospital Recommended Level of Care: Skilled Nursing Facility Prior Approval Number:    Date Approved/Denied:   PASRR Number: [id number])  Discharge Plan: SNF    Current Diagnoses: Patient Active Problem List   Diagnosis Date Noted  . PNA (pneumonia) 05/21/2018  . Thyroid nodule 02/06/2018  . Aspiration pneumonia (HCC) 01/27/2018  . Cough 10/30/2017  . Medicare annual wellness visit, initial 02/10/2017  . Advance care planning 02/10/2017  . Alzheimer's disease (HCC) 05/28/2015  . Snoring 05/28/2015    Orientation RESPIRATION BLADDER Height & Weight     Self  Normal Incontinent Weight: 194 lb (88 kg) Height:     BEHAVIORAL SYMPTOMS/MOOD NEUROLOGICAL BOWEL NUTRITION STATUS      Incontinent Diet( Dysphagia level 1 (PUREE) w/ NECTAR liquids; aspiration precautions; feeding support and Supervision at meals to Pinch straw and monitor bolus size/amount)  AMBULATORY STATUS COMMUNICATION OF NEEDS Skin   Extensive Assist Verbally Normal                       Personal Care Assistance Level of Assistance  Bathing, Feeding, Dressing Bathing Assistance: Limited assistance Feeding assistance: Independent Dressing Assistance: Limited assistance     Functional Limitations Info  Sight, Hearing, Speech Sight Info: Adequate Hearing Info: Impaired Speech Info: Adequate    SPECIAL CARE FACTORS FREQUENCY  PT (By licensed PT), OT (By licensed OT)     PT Frequency: (5) OT Frequency: (5)             Contractures      Additional Factors Info  Code Status, Allergies Code Status Info: (Full Code. ) Allergies Info: (No Known Allergies. )           Current Medications (05/23/2018):  This is the current hospital active medication list Current Facility-Administered Medications  Medication Dose Route Frequency Provider Last Rate Last Dose  . acetaminophen (TYLENOL) tablet 650 mg  650 mg Oral Q6H PRN Adrian Saran, MD       Or  . acetaminophen (TYLENOL) suppository 650 mg  650 mg Rectal Q6H PRN Mody, Sital, MD      . amoxicillin-clavulanate (AUGMENTIN) 875-125 MG per tablet 1 tablet  1 tablet Oral Q12H Salary, Montell D, MD   1 tablet at 05/23/18 1119  . donepezil (ARICEPT) tablet 10 mg  10 mg Oral Daily Adrian Saran, MD   10 mg at 05/23/18 1125  . enoxaparin (LOVENOX) injection 40 mg  40 mg Subcutaneous Q24H Adrian Saran, MD   40 mg at 05/22/18 2049  . erythromycin ophthalmic ointment   Both Eyes QHS Salary, Evelena Asa, MD   1 application at 05/22/18 2049  . escitalopram (LEXAPRO) tablet 10 mg  10 mg Oral QHS Mody, Sital, MD   10 mg at 05/22/18 2049  . haloperidol lactate (HALDOL) injection 2 mg  2 mg Intravenous Q4H PRN Adrian Saran, MD   2 mg at 05/23/18 0158  . loratadine (CLARITIN) tablet 10 mg  10 mg Oral Daily Mody, Sital,  MD   10 mg at 05/23/18 1121  . memantine (NAMENDA) tablet 10 mg  10 mg Oral BID Adrian SaranMody, Sital, MD   10 mg at 05/23/18 1125  . ondansetron (ZOFRAN) tablet 4 mg  4 mg Oral Q6H PRN Adrian SaranMody, Sital, MD       Or  . ondansetron (ZOFRAN) injection 4 mg  4 mg Intravenous Q6H PRN Mody, Sital, MD      . polyethylene glycol (MIRALAX / GLYCOLAX) packet 17 g  17 g Oral Daily PRN Mody, Sital, MD      . polyvinyl alcohol (LIQUIFILM TEARS) 1.4 % ophthalmic solution 1 drop  1 drop Both Eyes PRN Salary, Montell D, MD      . risperiDONE (RISPERDAL) tablet 0.25 mg  0.25 mg Oral BID Adrian SaranMody, Sital, MD   0.25 mg at 05/23/18 1125     Discharge Medications: Please see discharge summary  for a list of discharge medications.  Relevant Imaging Results:  Relevant Lab Results:   Additional Information (SSN: [id number])  Samanvi Cuccia, Darleen CrockerBailey M, LCSW

## 2018-05-23 NOTE — Progress Notes (Signed)
[facility] -  at [facility]      PATIENT NAME: Barrett ShellRobert Almquist    MR#:  [id number]  DATE OF BIRTH:  Jan 11, 1946  SUBJECTIVE:   Pt. Remains lethargic and encephalopathic this a.m. Wife at bedside.    REVIEW OF SYSTEMS:    Review of Systems  Unable to perform ROS: Dementia    Nutrition: Dysphagia 1 Tolerating Diet: Yes Tolerating PT: Eval noted.   DRUG ALLERGIES:  No Known Allergies  VITALS:  Blood pressure 138/82, pulse 90, temperature 98.8 F (37.1 C), temperature source Oral, resp. rate 18, weight 88 kg, SpO2 96 %.  PHYSICAL EXAMINATION:   Physical Exam  GENERAL:  72 y.o.-year-old patient lying in bed lethargic/Encephalopathic.   EYES: Pupils equal, round, reactive to light. No scleral icterus. Extraocular muscles intact.  HEENT: Head atraumatic, normocephalic. Oropharynx and nasopharynx clear.  NECK:  Supple, no jugular venous distention. No thyroid enlargement, no tenderness.  LUNGS: Normal breath sounds bilaterally, no wheezing, rales, rhonchi. No use of accessory muscles of respiration.  CARDIOVASCULAR: S1, S2 normal. No murmurs, rubs, or gallops.  ABDOMEN: Soft, nontender, nondistended. Bowel sounds present. No organomegaly or mass.  EXTREMITIES: No cyanosis, clubbing or edema b/l.    NEUROLOGIC: Cranial nerves II through XII are intact. No focal Motor or sensory deficits b/l.  Globally weak.  PSYCHIATRIC: The patient is alert and oriented x 1.  SKIN: No obvious rash, lesion, or ulcer.    LABORATORY PANEL:   CBC Recent Labs  Lab 05/22/18 0416  WBC 8.1  HGB 12.1*  HCT 36.3*  PLT 158   ------------------------------------------------------------------------------------------------------------------  Chemistries  Recent Labs  Lab 05/21/18 0938 05/22/18 0416  NA 136 139  K 3.9 3.6  CL 102 106  CO2 25 25  GLUCOSE 97 87  BUN 25* 23  CREATININE 0.96 1.01  CALCIUM 8.9 8.2*  AST 21  --   ALT 19  --   ALKPHOS 104  --     BILITOT 1.3*  --    ------------------------------------------------------------------------------------------------------------------  Cardiac Enzymes Recent Labs  Lab 05/21/18 0938  TROPONINI <0.03   ------------------------------------------------------------------------------------------------------------------  RADIOLOGY:  No results found.   ASSESSMENT AND PLAN:   72 year old male with history of dementia who presents due to altered mental status.  *Acute metabolic encephalopathy Most likely represents worsening chronic dementia Patient's mental status not improved since yesterday.  Patient got some Haldol yesterday.  Seen by physical therapy and the recommend short-term rehab.  Avoid any benzodiazepines.  *Chronic dementia Most likely worsened and this maybe pt's new baseline.  - cont. Namenda, Aricept, Risperdal.  - avoid Benzo's.    *Acute suspected aspiration pneumonia -Seen by speech therapy and placed on a pured diet with nectar thick liquids.   -continue empiric Augmentin.   All the records are reviewed and case discussed with Care Management/Social Worker. Management plans discussed with the patient, family and they are in agreement.  CODE STATUS: Full code  DVT Prophylaxis: Lovenox  TOTAL TIME TAKING CARE OF THIS PATIENT: 30 minutes.   POSSIBLE D/C IN 1-2 DAYS, DEPENDING ON CLINICAL CONDITION.   Houston SirenSAINANI,VIVEK J M.D on 05/23/2018 at 3:25 PM  Between 7am to 6pm - Pager - [phone]  After 6pm go to [email or web address] - Social research officer, governmentpassword EPAS [email or web address]  [facility] [place] [facility]  Office  [phone]  CC: Primary care physician; Joaquim Namuncan, Graham S, MD

## 2018-05-24 ENCOUNTER — Inpatient Hospital Stay: Payer: Medicare Other

## 2018-05-24 LAB — BASIC METABOLIC PANEL
Anion gap: 10 (ref 5–15)
BUN: 12 mg/dL (ref 8–23)
CALCIUM: 8.5 mg/dL — AB (ref 8.9–10.3)
CO2: 26 mmol/L (ref 22–32)
CREATININE: 0.62 mg/dL (ref 0.61–1.24)
Chloride: 105 mmol/L (ref 98–111)
GFR calc Af Amer: >60 mL/min (ref >60)
Glucose, Bld: 124 mg/dL — ABNORMAL HIGH (ref 70–99)
Potassium: 3.6 mmol/L (ref 3.5–5.1)
SODIUM: 141 mmol/L (ref 135–145)

## 2018-05-24 LAB — CBC
HCT: 39 % (ref 39.0–52.0)
HEMOGLOBIN: 13 g/dL (ref 13.0–17.0)
MCH: 31.9 pg (ref 26.0–34.0)
MCHC: 33.3 g/dL (ref 30.0–36.0)
MCV: 95.8 fL (ref 80.0–100.0)
PLATELETS: 172 10*3/uL (ref 150–400)
RBC: 4.07 MIL/uL — ABNORMAL LOW (ref 4.22–5.81)
RDW: 12.5 % (ref 11.5–15.5)
WBC: 7.4 10*3/uL (ref 4.0–10.5)
nRBC: 0 % (ref 0.0–0.2)

## 2018-05-24 LAB — CULTURE, RESPIRATORY W GRAM STAIN

## 2018-05-24 LAB — CULTURE, RESPIRATORY
CULTURE: NORMAL
SPECIAL REQUESTS: NORMAL

## 2018-05-24 NOTE — Progress Notes (Signed)
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  REVIEW OF SYSTEMS:    Review of Systems  Unable to perform ROS: Dementia    Nutrition: Dysphagia 1 Tolerating Diet: Yes Tolerating PT: Eval noted.   DRUG ALLERGIES:  No Known Allergies  VITALS:  Blood pressure (!) 138/93, pulse 80, temperature 98.6 F (37 C), resp. rate 18, weight 88 kg, SpO2 93 %.  PHYSICAL EXAMINATION:   Physical Exam  GENERAL:  72 y.o.-year-old patient lying in bed lethargic/Encephalopathic.   EYES: Pupils equal, round, reactive to light. No scleral icterus. Extraocular muscles intact.  HEENT: Head atraumatic, normocephalic. Oropharynx and nasopharynx clear.  NECK:  Supple, no jugular venous distention. No thyroid enlargement, no tenderness.  LUNGS: Normal breath sounds bilaterally, no wheezing, rales, rhonchi. No use of accessory muscles of respiration.  CARDIOVASCULAR: S1, S2 normal. No murmurs, rubs, or gallops.  ABDOMEN: Soft, nontender, nondistended. Bowel sounds present. No organomegaly or mass.  EXTREMITIES: No cyanosis, clubbing or edema b/l.    NEUROLOGIC: Cranial nerves II through XII are intact. No focal Motor or sensory deficits b/l.  Globally weak.  PSYCHIATRIC: The patient is alert and oriented x 1.  SKIN: No obvious rash, lesion, or ulcer.    LABORATORY PANEL:   CBC Recent Labs  Lab 05/24/18 0358  WBC 7.4  HGB 13.0  HCT 39.0  PLT 172   ------------------------------------------------------------------------------------------------------------------  Chemistries  Recent Labs  Lab 05/21/18 0938  05/24/18 0358  NA 136   < > 141  K 3.9   < > 3.6  CL 102   < > 105  CO2 25   < > 26    GLUCOSE 97   < > 124*  BUN 25*   < > 12  CREATININE 0.96   < > 0.62  CALCIUM 8.9   < > 8.5*  AST 21  --   --   ALT 19  --   --   ALKPHOS 104  --   --   BILITOT 1.3*  --   --    < > = values in this interval not displayed.   ------------------------------------------------------------------------------------------------------------------  Cardiac Enzymes Recent Labs  Lab 05/21/18 0938  TROPONINI <0.03   ------------------------------------------------------------------------------------------------------------------  RADIOLOGY:  Mr Brain Wo Contrast  Result Date: 05/24/2018 CLINICAL DATA:  72 year old male with unexplained altered mental status, encephalopathy. EXAM: MRI HEAD WITHOUT CONTRAST TECHNIQUE: Multiplanar, multiecho pulse sequences of the brain and surrounding structures were obtained without intravenous contrast. COMPARISON:  Head CT without contrast 05/21/2018. FINDINGS: Brain: Generalized cerebral volume loss, but disproportionate frontal and temporal lobe volume loss suspected in both hemispheres. No restricted diffusion to suggest acute infarction. No midline shift, mass effect, evidence of mass lesion, ventriculomegaly, extra-axial collection or acute intracranial hemorrhage. Cervicomedullary junction and pituitary are within normal limits. Wallace Cullens and white matter signal is within normal limits for age throughout the brain. No cortical encephalomalacia or chronic cerebral blood products identified. The deep gray matter nuclei, brainstem, and cerebellar signal appears normal. Vascular: Major intracranial vascular flow voids are preserved with mild generalized intracranial artery tortuosity. Skull and upper cervical spine: Negative for age visible cervical spine. Normal bone marrow signal. Sinuses/Orbits: Postoperative changes to both globes. Otherwise negative orbits. New  fluid levels in the sphenoid sinuses. Mild paranasal sinus mucosal thickening elsewhere. Other: Mastoids  remain clear. Visible internal auditory structures appear normal. Small volume retained secretions in the nasopharynx. Scalp and face soft tissues appear negative. IMPRESSION: 1. No acute intracranial abnormality. No chronic ischemic changes identified. 2. Generalized cerebral volume loss, but seems disproportionate in the frontal and temporal lobes. 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 3. Paranasal sinus fluid levels and mild mucosal thickening is new from the recent head CT. Electronically Signed   By: Odessa FlemingH  Hall M.D.   On: 05/24/2018 11:08     ASSESSMENT AND PLAN:   72 year old male with history of dementia who presents due to altered mental status.  *Acute metabolic encephalopathy Most likely represents worsening chronic dementia -As per the patient's wife this is not his baseline and therefore I obtained a MRI of the brain today to rule out acute stroke.  MRI of the brain is negative but shows significant volume loss in the frontotemporal area consistent with frontotemporal dementia. - Avoid benzodiazepines, continue Haldol as needed for agitation.  Seen by physical therapy and patient will likely need short-term rehab placement.  *Chronic dementia Most likely worsened and this maybe pt's new baseline.  - cont. Namenda, Aricept, Risperdal.  - avoid Benzo's.   *Acute suspected aspiration pneumonia -Seen by speech therapy and placed on a pured diet with nectar thick liquids.   -continue empiric Augmentin.  Possible d/c to SNF/STR in next 1-2 days. Discussed plan of care with pt's wife at bedside and she in agreement.   All the records are reviewed and case discussed with Care Management/Social Worker. Management plans discussed with the patient, family and they are in agreement.  CODE STATUS: Full code  DVT Prophylaxis: Lovenox  TOTAL TIME TAKING CARE OF THIS PATIENT: 30 minutes.   POSSIBLE D/C IN 1-2 DAYS, DEPENDING ON CLINICAL CONDITION.   Houston SirenSAINANI,VIVEK J M.D on 05/24/2018  at 12:16 PM  Between 7am to 6pm - Pager - [phone]  After 6pm go to [email or web address] - Social research officer, governmentpassword EPAS [email or web address]  [facility] [place] [facility]  Office  [phone]  CC: Primary care physician; Joaquim Namuncan, Graham S, MD

## 2018-05-24 NOTE — Clinical Social Work Note (Signed)
CSW met with patient's wife to present bed offers.  She asked about possibly taking patient home with home health instead of going to SNF.  Patient's wife, said she is going to meet with their son tomorrow, and tour facilities to make a decision.  She said she will inform CSW what her decisions is.  Patient's wife was informed that [facility] did not have a bed available.  Jones Broom. [place], MSW, [place]  05/24/2018 2:09 PM

## 2018-05-24 NOTE — Care Management (Signed)
If wife decides to take [place], will have home health through [facility] and outpatient palliative [facility]. Contact Tiffany Webb 1 [phone]

## 2018-05-25 ENCOUNTER — Inpatient Hospital Stay: Payer: Medicare Other

## 2018-05-25 LAB — URINALYSIS, ROUTINE W REFLEX MICROSCOPIC
Bacteria, UA: NONE SEEN
GLUCOSE, UA: NEGATIVE mg/dL
Hgb urine dipstick: NEGATIVE
Ketones, ur: 5 mg/dL — AB
Leukocytes, UA: NEGATIVE
Nitrite: NEGATIVE
Protein, ur: 100 mg/dL — AB
Specific Gravity, Urine: 1.039 — ABNORMAL HIGH (ref 1.005–1.030)
pH: 5 (ref 5.0–8.0)

## 2018-05-25 LAB — GASTROINTESTINAL PANEL BY PCR, STOOL (REPLACES STOOL CULTURE)

## 2018-05-25 LAB — C DIFFICILE QUICK SCREEN W PCR REFLEX
C Diff antigen: NEGATIVE
C Diff interpretation: NOT DETECTED
C Diff toxin: NEGATIVE

## 2018-05-25 LAB — LACTIC ACID, PLASMA: Lactic Acid, Venous: 0.9 mmol/L (ref 0.5–1.9)

## 2018-05-25 MED ORDER — SODIUM CHLORIDE 0.9 % IV BOLUS
1000.0000 mL | Freq: Once | INTRAVENOUS | Status: AC
  Administered 2018-05-25: 21:00:00 1000 mL via INTRAVENOUS

## 2018-05-25 MED ORDER — AMOXICILLIN-POT CLAVULANATE 875-125 MG PO TABS: 1.0000 | ORAL_TABLET | Freq: Two times a day (BID) | ORAL | 0 refills | Status: DC

## 2018-05-25 MED ORDER — SODIUM CHLORIDE 0.9 % IV SOLN
3.0000 g | Freq: Four times a day (QID) | INTRAVENOUS | Status: DC
  Administered 2018-05-25 – 2018-05-26 (×3): 3 g via INTRAVENOUS
  Filled 2018-05-25 (×6): qty 3

## 2018-05-25 NOTE — Clinical Social Work Note (Signed)
Discharge has been cancelled today due to patient having a fever. CSW notified Tina at [facility] of change. Patient will discharge to [facility] when medically ready.   Ruthe Mannanandace Jiro Kiester MSW, [address] [phone]

## 2018-05-25 NOTE — Care Management Important Message (Signed)
Copy of signed IM left with patient in room.  

## 2018-05-25 NOTE — Progress Notes (Signed)
Pt being discharged to [facility], report called to Micheal, pt with no complaints, wife at bedside, EMS called for transport

## 2018-05-25 NOTE — Progress Notes (Signed)
  Speech Language Pathology Treatment: Dysphagia  Patient Details Name: Bryan ShellRobert Berry MRN: [id number] DOB: 24-Oct-1945 Today's Date: 108/26/202019 Time: [id number] SLP Time Calculation (min) (ACUTE ONLY): 30 min  Assessment / Plan / Recommendation Clinical Impression  Pt continues to present with an oral pharyngeal dysphagia as characterized by poor mastication. Pt utilizes mastication reflex with all po trials. Pt was also noted to have a wet vocal quality and coughing post intake. Pt did not attempt communication during session. Pt was total assistance for feeding via his wife. SLP provided education with wife taking notes for how to prepare puree foods, foods that are natrually puree. Wife states she made baby food for their son. SLP educated that this would be similar and to ensure the foods are not thin. SLP educated on where to purchase and how to thicken liquids to nectar thick liquid levels. His wife was able to provide verbal agreement to diet and states she has altered his diet to Dys 2 since august when he was hospitalized for aspiration pna. SLP will continue to follow up, however snf is recommended with hh st recommended if home placement occurs.    HPI HPI: Pt is a 72 y.o. male with a known history of advanced Dementia who presents from home due to altered mental status and cough.  Patient has Dementia.  HPI is taken from ER physician and nurse at bedside, Wife.  Apparently over the past day, patient has had altered mental status and is more confused than normal.  Normally he is awake and conversant and follows some commands, however today, he is unable to do so.  He was very weak and so was brought to the ER for further evaluation.  In the emergency room he has a congested cough.  Chest x-ray does not show pneumonia however there is concern for pneumonia.  There is no fever or chills documented.  He was started on Zosyn and vancomycin.  This morning, pt is more awake though keeps eyes closed.   He responds w/ few mumbled words w/ low volume but does respond appropriately w/ opening mouth when given Mod tactile/verbal cues.  Noted OM tremors.     Diet recommendation:  Dysphagia 1 (puree) with nectar thick liquids.   SLP Plan  Continue with current plan of care       Recommendations  Diet recommendations: Dysphagia 1 (puree);Nectar-thick liquid Liquids provided via: Cup Medication Administration: Crushed with puree Supervision: Staff to assist with self feeding Compensations: Slow rate;Small sips/bites;Follow solids with liquid Postural Changes and/or Swallow Maneuvers: Out of bed for meals;Seated upright 90 degrees                Oral Care Recommendations: Oral care BID;Staff/trained caregiver to provide oral care SLP Visit Diagnosis: Dysphagia, oropharyngeal phase (R13.12) Plan: Continue with current plan of care       GO                Meredith PelStacie Harris [facility] 108/26/202019, 9:46 AM

## 2018-05-25 NOTE — Progress Notes (Addendum)
Called [facility] made Casimiro NeedleMichael aware that pt not being discharged today related to fevers, wife at bedside

## 2018-05-25 NOTE — Discharge Summary (Signed)
[facility] [place] at [facility]   PATIENT NAME: Bryan Berry    MR#:  [id number]  DATE OF BIRTH:  08/02/1945  DATE OF ADMISSION:  05/21/2018 ADMITTING PHYSICIAN: Adrian Saran, MD  DATE OF DISCHARGE: No discharge date for patient encounter.  PRIMARY CARE PHYSICIAN: Joaquim Nam, MD    ADMISSION DIAGNOSIS:  Edema [R60.9] Cough [R05] Altered mental status, unspecified altered mental status type [R41.82]  DISCHARGE DIAGNOSIS:  Active Problems:   PNA (pneumonia)   SECONDARY DIAGNOSIS:   Past Medical History:  Diagnosis Date  . Allergy   . Dementia (HCC)   . Memory loss   . UTI (lower urinary tract infection)    one episode    HOSPITAL COURSE:   72 year old male with history of dementia who presents due to altered mental status.  *Acute metabolic encephalopathy-patient presented to the hospital due to worsening mental status.  Initially this was thought to be secondary to aspiration pneumonia and infection.  Patient was adequately treated for that but his mental status did not improved. - Patient had a CT head admission which was negative, subsequently patient underwent an MRI of the brain which was negative for acute stroke but more consistent with frontotemporal atrophy consistent with advanced dementia. - Most likely patient's mental status is secondary to his advanced dementia.  *Chronic dementia Most likely worsened and this maybe pt's new baseline.  - cont. Namenda, Aricept, Risperdal.  - avoid Benzo's.   *Acute suspected aspiration pneumonia -Seen by speech therapy and placed on a pured diet with nectar thick liquids and will need speech to follow patient at SNF if he improved.  -continue empiric Augmentin X 5 more days.   Due to patient's change in mental status and poor mobility a physical therapy consult was obtained and the recommended short-term rehab.  Patient's wife is accepted transfer to short-term rehab.  If patient does not  improve at short-term rehab and continues to decline the patient's wife would like to take him home with possibly hospice services.  Palliative care should follow patient at the skilled nursing facility.   DISCHARGE CONDITIONS:   Stable  CONSULTS OBTAINED:    DRUG ALLERGIES:  No Known Allergies  DISCHARGE MEDICATIONS:   Allergies as of 110-10-2017   No Known Allergies     Medication List    STOP taking these medications   benzonatate 200 MG capsule Commonly known as:  TESSALON   rivastigmine 4.6 mg/24hr Commonly known as:  EXELON     TAKE these medications   amoxicillin-clavulanate 875-125 MG tablet Commonly known as:  AUGMENTIN Take 1 tablet by mouth 2 (two) times daily for 5 days.   donepezil 10 MG tablet Commonly known as:  ARICEPT TAKE 1 TABLET BY MOUTH DAILY IN THE MORNING What changed:    how much to take  how to take this  when to take this   escitalopram 10 MG tablet Commonly known as:  LEXAPRO Take 1 tablet (10 mg total) by mouth at bedtime.   fexofenadine 180 MG tablet Commonly known as:  ALLEGRA Take 180 mg by mouth daily as needed for allergies.   memantine 10 MG tablet Commonly known as:  NAMENDA Take 1 tablet (10 mg total) by mouth 2 (two) times daily.   risperiDONE 0.25 MG tablet Commonly known as:  RISPERDAL Take 1 tablet (0.25 mg total) by mouth 2 (two) times daily.         DISCHARGE INSTRUCTIONS:   DIET:  Regular diet  Pureed diet with nectar thick liquids and strict aspiration Precautions. Speech to follow patient.   DISCHARGE CONDITION:  Stable  ACTIVITY:  Activity as tolerated  OXYGEN:  Home Oxygen: No.   Oxygen Delivery: room air  DISCHARGE LOCATION:  nursing home   If you experience worsening of your admission symptoms, develop shortness of breath, life threatening emergency, suicidal or homicidal thoughts you must seek medical attention immediately by calling 911 or calling your MD immediately  if symptoms  less severe.  You Must read complete instructions/literature along with all the possible adverse reactions/side effects for all the Medicines you take and that have been prescribed to you. Take any new Medicines after you have completely understood and accpet all the possible adverse reactions/side effects.   Please note  You were cared for by a hospitalist during your hospital stay. If you have any questions about your discharge medications or the care you received while you were in the hospital after you are discharged, you can call the unit and asked to speak with the hospitalist on call if the hospitalist that took care of you is not available. Once you are discharged, your primary care physician will handle any further medical issues. Please note that NO REFILLS for any discharge medications will be authorized once you are discharged, as it is imperative that you return to your primary care physician (or establish a relationship with a primary care physician if you do not have one) for your aftercare needs so that they can reassess your need for medications and monitor your lab values.     Today   No acute events overnight. Mental status not much improved. Follow some simple commands. Wife is at bedside.   VITAL SIGNS:  Blood pressure 120/65, pulse 92, temperature 98.8 F (37.1 C), temperature source Oral, resp. rate 17, weight 88 kg, SpO2 92 %.  I/O:    Intake/Output Summary (Last 24 hours) at 1Mar 12, 202019 1044 Last data filed at 05/24/2018 1815 Gross per 24 hour  Intake 0 ml  Output -  Net 0 ml    PHYSICAL EXAMINATION:   GENERAL:  72 y.o.-year-old patient lying in bed lethargic/Encephalopathic.   EYES: Pupils equal, round, reactive to light. No scleral icterus. Extraocular muscles intact.  HEENT: Head atraumatic, normocephalic. Oropharynx and nasopharynx clear.  NECK:  Supple, no jugular venous distention. No thyroid enlargement, no tenderness.  LUNGS: Normal breath sounds  bilaterally, no wheezing, rales, rhonchi. No use of accessory muscles of respiration.  CARDIOVASCULAR: S1, S2 normal. No murmurs, rubs, or gallops.  ABDOMEN: Soft, nontender, nondistended. Bowel sounds present. No organomegaly or mass.  EXTREMITIES: No cyanosis, clubbing or edema b/l.    NEUROLOGIC: Cranial nerves II through XII are intact. No focal Motor or sensory deficits b/l.  Globally weak.  PSYCHIATRIC: The patient is alert and oriented x 1.  SKIN: No obvious rash, lesion, or ulcer.   DATA REVIEW:   CBC Recent Labs  Lab 05/24/18 0358  WBC 7.4  HGB 13.0  HCT 39.0  PLT 172    Chemistries  Recent Labs  Lab 05/21/18 0938  05/24/18 0358  NA 136   < > 141  K 3.9   < > 3.6  CL 102   < > 105  CO2 25   < > 26  GLUCOSE 97   < > 124*  BUN 25*   < > 12  CREATININE 0.96   < > 0.62  CALCIUM 8.9   < > 8.5*  AST 21  --   --  ALT 19  --   --   ALKPHOS 104  --   --   BILITOT 1.3*  --   --    < > = values in this interval not displayed.    Cardiac Enzymes Recent Labs  Lab 05/21/18 0938  TROPONINI <0.03    Microbiology Results  Results for orders placed or performed during the hospital encounter of 05/21/18  Blood culture (routine x 2)     Status: None (Preliminary result)   Collection Time: 05/21/18  9:31 AM  Result Value Ref Range Status   Specimen Description BLOOD LEFT ARM  Final   Special Requests   Final    BOTTLES DRAWN AEROBIC AND ANAEROBIC Blood Culture adequate volume   Culture   Final    NO GROWTH 4 DAYS Performed at [facility], [address] [place], [place] [address]    Report Status PENDING  Incomplete  Blood culture (routine x 2)     Status: None (Preliminary result)   Collection Time: 05/21/18  9:31 AM  Result Value Ref Range Status   Specimen Description BLOOD LEFT ARM  Final   Special Requests   Final    BOTTLES DRAWN AEROBIC AND ANAEROBIC Blood Culture adequate volume   Culture   Final    NO GROWTH 4 DAYS Performed at [facility], [address], [place], [place] [address]    Report Status PENDING  Incomplete  Expectorated sputum assessment w rflx to resp cult     Status: None   Collection Time: 05/21/18 11:58 AM  Result Value Ref Range Status   Specimen Description SPUTUM  Final   Special Requests Normal  Final   Sputum evaluation   Final    THIS SPECIMEN IS ACCEPTABLE FOR SPUTUM CULTURE Performed at [facility], [address], [place], [place] [address]    Report Status 05/21/2018 FINAL  Final  Culture, respiratory     Status: None   Collection Time: 05/21/18 11:58 AM  Result Value Ref Range Status   Specimen Description   Final    SPUTUM Performed at [facility], [address] [place], [place] [address]    Special Requests   Final    Normal Reflexed from [id number] Performed at [facility], [address] [place], [place] [address]    Gram Stain   Final    MODERATE WBC PRESENT, PREDOMINANTLY PMN FEW SQUAMOUS EPITHELIAL CELLS PRESENT ABUNDANT GRAM POSITIVE COCCI ABUNDANT GRAM NEGATIVE RODS ABUNDANT GRAM POSITIVE RODS    Culture   Final    ABUNDANT Consistent with normal respiratory flora. Performed at [facility], [address] [place], [place] [address]    Report Status 05/24/2018 FINAL  Final  MRSA PCR Screening     Status: None   Collection Time: 05/21/18 12:28 PM  Result Value Ref Range Status   MRSA by PCR NEGATIVE NEGATIVE Final    Comment:        The GeneXpert MRSA Assay (FDA approved for NASAL specimens only), is one component of a comprehensive MRSA colonization surveillance program. It is not intended to diagnose MRSA infection nor to guide or monitor treatment for MRSA infections. Performed at [facility], [address] [place], [place] [address]     RADIOLOGY:  Mr Sherrin Daisy Contrast  Result Date: 05/24/2018 CLINICAL DATA:  72 year old male with unexplained altered mental status, encephalopathy. EXAM: MRI  HEAD WITHOUT CONTRAST TECHNIQUE: Multiplanar, multiecho pulse sequences of the  brain and surrounding structures were obtained without intravenous contrast. COMPARISON:  Head CT without contrast 05/21/2018. FINDINGS: Brain: Generalized cerebral volume loss, but disproportionate frontal and temporal lobe volume loss suspected in both hemispheres. No restricted diffusion to suggest acute infarction. No midline shift, mass effect, evidence of mass lesion, ventriculomegaly, extra-axial collection or acute intracranial hemorrhage. Cervicomedullary junction and pituitary are within normal limits. Wallace CullensGray and white matter signal is within normal limits for age throughout the brain. No cortical encephalomalacia or chronic cerebral blood products identified. The deep gray matter nuclei, brainstem, and cerebellar signal appears normal. Vascular: Major intracranial vascular flow voids are preserved with mild generalized intracranial artery tortuosity. Skull and upper cervical spine: Negative for age visible cervical spine. Normal bone marrow signal. Sinuses/Orbits: Postoperative changes to both globes. Otherwise negative orbits. New fluid levels in the sphenoid sinuses. Mild paranasal sinus mucosal thickening elsewhere. Other: Mastoids remain clear. Visible internal auditory structures appear normal. Small volume retained secretions in the nasopharynx. Scalp and face soft tissues appear negative. IMPRESSION: 1. No acute intracranial abnormality. No chronic ischemic changes identified. 2. Generalized cerebral volume loss, but seems disproportionate in the frontal and temporal lobes. 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 3. Paranasal sinus fluid levels and mild mucosal thickening is new from the recent head CT. Electronically Signed   By: Odessa FlemingH  Hall M.D.   On: 05/24/2018 11:08      Management plans discussed with the patient, family and they are in agreement.  CODE STATUS:     Code Status Orders  (From admission, onward)          Start     Ordered   05/21/18 1554  Full code  Continuous     05/21/18 1553        TOTAL TIME TAKING CARE OF THIS PATIENT: 40 minutes.    Houston SirenSAINANI,Maela Takeda J M.D on 103-Aug-202019 at 10:44 AM  Between 7am to 6pm - Pager - [phone]  After 6pm go to [email or web address] - Social research officer, governmentpassword EPAS [email or web address]  [facility] [place] [facility]  Office  [phone]  CC: Primary care physician; Joaquim Namuncan, Graham S, MD

## 2018-05-25 NOTE — Progress Notes (Signed)
[facility] [place] at [facility]      PATIENT NAME: Bryan Berry    MR#:  [id number]  DATE OF BIRTH:  1945/09/06  SUBJECTIVE:   Patient was going to be discharged today to skilled nursing facility but had a fever of 101 yesterday and also 102 this afternoon.  Given patient's dementia he cannot give [place] any complaints.  REVIEW OF SYSTEMS:    Review of Systems  Unable to perform ROS: Dementia    Nutrition: Dysphagia 1 Tolerating Diet: Yes Tolerating PT: Eval noted.   DRUG ALLERGIES:  No Known Allergies  VITALS:  Blood pressure 110/70, pulse (!) 101, temperature (!) 101.1 F (38.4 C), temperature source Rectal, resp. rate 18, weight 88 kg, SpO2 94 %.  PHYSICAL EXAMINATION:   Physical Exam  GENERAL:  72 y.o.-year-old patient lying in bed lethargic/Encephalopathic.   EYES: Pupils equal, round, reactive to light. No scleral icterus. Extraocular muscles intact.  HEENT: Head atraumatic, normocephalic. Oropharynx and nasopharynx clear.  NECK:  Supple, no jugular venous distention. No thyroid enlargement, no tenderness.  LUNGS: Normal breath sounds bilaterally, no wheezing, rales, rhonchi. No use of accessory muscles of respiration.  CARDIOVASCULAR: S1, S2 normal. No murmurs, rubs, or gallops.  ABDOMEN: Soft, nontender, nondistended. Bowel sounds present. No organomegaly or mass.  EXTREMITIES: No cyanosis, clubbing or edema b/l.    NEUROLOGIC: Cranial nerves II through XII are intact. No focal Motor or sensory deficits b/l.  Globally weak.  PSYCHIATRIC: The patient is alert and oriented x 1.  SKIN: No obvious rash, lesion, or ulcer.    LABORATORY PANEL:   CBC Recent Labs  Lab 05/24/18 0358  WBC 7.4  HGB 13.0  HCT 39.0  PLT 172   ------------------------------------------------------------------------------------------------------------------  Chemistries  Recent Labs  Lab 05/21/18 0938  05/24/18 0358  NA 136   < > 141  K 3.9   < > 3.6  CL  102   < > 105  CO2 25   < > 26  GLUCOSE 97   < > 124*  BUN 25*   < > 12  CREATININE 0.96   < > 0.62  CALCIUM 8.9   < > 8.5*  AST 21  --   --   ALT 19  --   --   ALKPHOS 104  --   --   BILITOT 1.3*  --   --    < > = values in this interval not displayed.   ------------------------------------------------------------------------------------------------------------------  Cardiac Enzymes Recent Labs  Lab 05/21/18 0938  TROPONINI <0.03   ------------------------------------------------------------------------------------------------------------------  RADIOLOGY:  Mr Brain Wo Contrast  Result Date: 05/24/2018 CLINICAL DATA:  72 year old male with unexplained altered mental status, encephalopathy. EXAM: MRI HEAD WITHOUT CONTRAST TECHNIQUE: Multiplanar, multiecho pulse sequences of the brain and surrounding structures were obtained without intravenous contrast. COMPARISON:  Head CT without contrast 05/21/2018. FINDINGS: Brain: Generalized cerebral volume loss, but disproportionate frontal and temporal lobe volume loss suspected in both hemispheres. No restricted diffusion to suggest acute infarction. No midline shift, mass effect, evidence of mass lesion, ventriculomegaly, extra-axial collection or acute intracranial hemorrhage. Cervicomedullary junction and pituitary are within normal limits. Wallace Cullens and white matter signal is within normal limits for age throughout the brain. No cortical encephalomalacia or chronic cerebral blood products identified. The deep gray matter nuclei, brainstem, and cerebellar signal appears normal. Vascular: Major intracranial vascular flow voids are preserved with mild generalized intracranial artery tortuosity. Skull and upper cervical spine: Negative for age visible cervical spine. Normal  bone marrow signal. Sinuses/Orbits: Postoperative changes to both globes. Otherwise negative orbits. New fluid levels in the sphenoid sinuses. Mild paranasal sinus mucosal  thickening elsewhere. Other: Mastoids remain clear. Visible internal auditory structures appear normal. Small volume retained secretions in the nasopharynx. Scalp and face soft tissues appear negative. IMPRESSION: 1. No acute intracranial abnormality. No chronic ischemic changes identified. 2. Generalized cerebral volume loss, but seems disproportionate in the frontal and temporal lobes. 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 3. Paranasal sinus fluid levels and mild mucosal thickening is new from the recent head CT. Electronically Signed   By: Odessa FlemingH  Hall M.D.   On: 05/24/2018 11:08     ASSESSMENT AND PLAN:   72 year old male with history of dementia who presents due to altered mental status.  *Acute metabolic encephalopathy Most likely represents worsening chronic dementia -As per the patient's wife this is not his baseline and therefore I obtained a MRI of the brain yesterday to rule out acute stroke.  MRI of the brain was negative but shows significant volume loss in the frontotemporal area consistent with frontotemporal dementia. - Avoid benzodiazepines, continue Haldol as needed for agitation.  Seen by physical therapy and patient will likely need short-term rehab placement.  * Fever of unknown origin - pt. Had a fever of 102 yesterday and 101 today. Source unclear.  - BC collected on admission were (-). Will get repeat BC today. Check UA, CXR and follow fever curve.  - hold off on changing abx for now.  Tylenol PRN for fever.   *Chronic dementia Most likely worsened and this maybe pt's new baseline.  - cont. Namenda, Aricept, Risperdal.  - avoid Benzo's.   *Acute suspected aspiration pneumonia -Seen by speech therapy and placed on a pured diet with nectar thick liquids.   -continue empiric Augmentin.  Possible d/c to SNF/STR in next 1-2 days if fever has improved.   All the records are reviewed and case discussed with Care Management/Social Worker. Management plans discussed with the  patient, family and they are in agreement.  CODE STATUS: Full code  DVT Prophylaxis: Lovenox  TOTAL TIME TAKING CARE OF THIS PATIENT: 30 minutes.   POSSIBLE D/C IN 1-2 DAYS, DEPENDING ON CLINICAL CONDITION.   [place] SirenSAINANI,Mysty Kielty J M.D on 110/28/2019 at 1:10 PM  Between 7am to 6pm - Pager - [phone]  After 6pm go to [email or web address] - Social research officer, governmentpassword EPAS [email or web address]  [facility] [place] [facility]  Office  [phone]  CC: Primary care physician; Joaquim Namuncan, Graham S, MD

## 2018-05-25 NOTE — Progress Notes (Signed)
Pharmacy Antibiotic Note  Bryan ShellRobert Berry is a 72 y.o. male admitted on 05/21/2018 with aspiration pneumonia.  Pharmacy has been consulted for Unasyn dosing.  Plan: Unasyn 3g IV q6h  Weight: 194 lb (88 kg)  Temp (24hrs), Avg:100.4 F (38 C), Min:98.8 F (37.1 C), Max:102.2 F (39 C)  Recent Labs  Lab 05/21/18 0931 05/21/18 0938 05/22/18 0416 05/24/18 0358  WBC  --  11.3* 8.1 7.4  CREATININE  --  0.96 1.01 0.62  LATICACIDVEN 1.1  --   --   --     Estimated Creatinine Clearance: 88.6 mL/min (by C-G formula based on SCr of 0.62 mg/dL).    No Known Allergies  Antimicrobials this admission: unasyn 11/25 >> 11/26 Augmentin 11/26 >> 11/29 Unasyn 11/29 >>   Thank you for allowing pharmacy to be a part of this patient's care.  Clovia CuffLisa Iridian Reader, PharmD, BCPS 101/05/2018 2:51 PM

## 2018-05-25 NOTE — Clinical Social Work Note (Signed)
CSW spoke with patient and wife at bedside. Wife states that she would like patient to go to [facility]. CSW notified Tina at [facility] of bed acceptance. CSW will continue to follow for discharge planning.   Ruthe Mannanandace Donnice Nielsen MSW, [address] Archer RdCSWA [phone]

## 2018-05-25 NOTE — Progress Notes (Signed)
   05/25/18 2100  Clinical Encounter Type  Visited With Patient;Family (Wife, Bryan Berry)  Visit Type Initial;Spiritual support;Other (Comment) (OR for Prayer)  Referral From Nurse  Recommendations Follow-up, if requested.  Spiritual Encounters  Spiritual Needs Emotional;Prayer   Chaplain responded to a request for prayer and met with the patient and his wife Bryan Berry. With a recent downturn in the patient's health, Bryan Berry was teary and needed emotional support. Chaplain provided active listening, empathetic responses, presence, and prayer. Further, Chaplain prayed for a blessing for the patient that he may recover sufficiently to return home.

## 2018-05-25 NOTE — Progress Notes (Signed)
PT Cancellation Note  Patient Details Name: Bryan ShellRobert Berry MRN: [id number] DOB: 05/10/1946   Cancelled Treatment:    Reason Eval/Treat Not Completed: Fatigue/lethargy limiting ability to participate; Pt d/c was cancelled secondary to increasing fever.  Discussion with nurse prior to trying to see him she states "I don't think you're going to get too far with him today." Pt confused, lethargic; nursing suggested letting him rest today.  Will hold PT this date.   Malachi ProGalen R Doreena Maulden, DPT [phone], 3:40 PM

## 2018-05-25 NOTE — Clinical Social Work Note (Signed)
Patient is medically ready for discharge today. CSW notified patient and wife at bedside of discharge today. Both are in agreement with discharge to [facility] today. CSW notified Inetta Fermoina at [facility] of discharge today. Patient will be transported by EMS. RN to call report and call for transport.   Ruthe Mannanandace Anysia Choi MSW, [address]CSWA [phone]

## 2018-05-26 DIAGNOSIS — R4182 Altered mental status, unspecified: Secondary | ICD-10-CM

## 2018-05-26 LAB — CBC
HCT: 38.2 % — ABNORMAL LOW (ref 39.0–52.0)
Hemoglobin: 12.6 g/dL — ABNORMAL LOW (ref 13.0–17.0)
MCH: 31.7 pg (ref 26.0–34.0)
MCHC: 33 g/dL (ref 30.0–36.0)
MCV: 96.2 fL (ref 80.0–100.0)
PLATELETS: 203 10*3/uL (ref 150–400)
RBC: 3.97 MIL/uL — ABNORMAL LOW (ref 4.22–5.81)
RDW: 13.1 % (ref 11.5–15.5)
WBC: 15.4 10*3/uL — ABNORMAL HIGH (ref 4.0–10.5)
nRBC: 0 % (ref 0.0–0.2)

## 2018-05-26 LAB — CULTURE, BLOOD (ROUTINE X 2)
Culture: NO GROWTH
Culture: NO GROWTH
Special Requests: ADEQUATE
Special Requests: ADEQUATE

## 2018-05-26 LAB — TSH: TSH: 1.857 u[IU]/mL (ref 0.350–4.500)

## 2018-05-26 LAB — CK: CK TOTAL: 24 U/L — AB (ref 49–397)

## 2018-05-26 MED ORDER — VANCOMYCIN HCL 10 G IV SOLR
1250.0000 mg | Freq: Once | INTRAVENOUS | Status: AC
  Administered 2018-05-26: 1250 mg via INTRAVENOUS
  Filled 2018-05-26: qty 1250

## 2018-05-26 MED ORDER — DEXTROSE 5 % IV SOLN
10.0000 mg/kg | Freq: Three times a day (TID) | INTRAVENOUS | Status: DC
  Administered 2018-05-26: 15:00:00 880 mg via INTRAVENOUS
  Filled 2018-05-26 (×6): qty 17.6

## 2018-05-26 MED ORDER — SODIUM CHLORIDE 0.9 % IV SOLN
INTRAVENOUS | Status: DC
  Administered 2018-05-26: 14:00:00 via INTRAVENOUS

## 2018-05-26 MED ORDER — SODIUM CHLORIDE 0.9 % IV SOLN
5.0000 mg/kg | Freq: Two times a day (BID) | INTRAVENOUS | Status: DC
  Administered 2018-05-26 – 2018-05-27 (×3): 440 mg via INTRAVENOUS
  Filled 2018-05-26 (×5): qty 8.8

## 2018-05-26 MED ORDER — SODIUM CHLORIDE 0.9 % IV SOLN
2.0000 g | INTRAVENOUS | Status: DC
  Administered 2018-05-26 – 2018-05-28 (×12): 2 g via INTRAVENOUS
  Filled 2018-05-26 (×2): qty 2
  Filled 2018-05-26: qty 2000
  Filled 2018-05-26: qty 2
  Filled 2018-05-26: qty 2000
  Filled 2018-05-26 (×3): qty 2
  Filled 2018-05-26: qty 2000
  Filled 2018-05-26 (×2): qty 2
  Filled 2018-05-26: qty 2000
  Filled 2018-05-26 (×4): qty 2

## 2018-05-26 MED ORDER — VANCOMYCIN HCL 10 G IV SOLR
1250.0000 mg | Freq: Two times a day (BID) | INTRAVENOUS | Status: DC
  Administered 2018-05-26 – 2018-05-27 (×3): 1250 mg via INTRAVENOUS
  Filled 2018-05-26 (×5): qty 1250

## 2018-05-26 MED ORDER — SODIUM CHLORIDE 0.9 % IV SOLN
2.0000 g | Freq: Two times a day (BID) | INTRAVENOUS | Status: DC
  Administered 2018-05-26 – 2018-05-27 (×4): 2 g via INTRAVENOUS
  Filled 2018-05-26: qty 2
  Filled 2018-05-26: qty 20
  Filled 2018-05-26: qty 2
  Filled 2018-05-26: qty 20
  Filled 2018-05-26 (×2): qty 2

## 2018-05-26 NOTE — Consult Note (Addendum)
Reason for Consult: AMS Referring Physician: Dr. Hipolito BayleySaiani   CC: AMS   HPI: Barrett ShellRobert Lessner is an 72 y.o. male with hx of dementia presented with altered mental status few days ago. Pt has been noted to have increased fevers and somnolence.    Past Medical History:  Diagnosis Date  . Allergy   . Dementia (HCC)   . Memory loss   . UTI (lower urinary tract infection)    one episode    Past Surgical History:  Procedure Laterality Date  . CATARACT EXTRACTION W/ INTRAOCULAR LENS  IMPLANT, BILATERAL     feb and march 2019  . VARICOSE VEIN SURGERY      Family History  Problem Relation Age of Onset  . Heart disease Father   . Colon cancer Neg Hx   . Prostate cancer Neg Hx     Social History:  reports that he has quit smoking. He has never used smokeless tobacco. He reports that he drinks alcohol. He reports that he does not use drugs.  No Known Allergies  Medications: I have reviewed the patient's current medications.  ROS: Unable to obtain due to confusion  Physical Examination: Blood pressure 114/64, pulse 88, temperature 98.9 F (37.2 C), temperature source Oral, resp. rate 16, weight 88 kg, SpO2 97 %.  Pt is not verbal, not following commands Withdraws from pain bilaterally.    Laboratory Studies:   Basic Metabolic Panel: Recent Labs  Lab 05/21/18 0938 05/22/18 0416 05/24/18 0358  NA 136 139 141  K 3.9 3.6 3.6  CL 102 106 105  CO2 25 25 26   GLUCOSE 97 87 124*  BUN 25* 23 12  CREATININE 0.96 1.01 0.62  CALCIUM 8.9 8.2* 8.5*    Liver Function Tests: Recent Labs  Lab 05/21/18 0938  AST 21  ALT 19  ALKPHOS 104  BILITOT 1.3*  PROT 6.6  ALBUMIN 3.7   No results for input(s): LIPASE, AMYLASE in the last 168 hours. No results for input(s): AMMONIA in the last 168 hours.  CBC: Recent Labs  Lab 05/21/18 0938 05/22/18 0416 05/24/18 0358 05/26/18 1058  WBC 11.3* 8.1 7.4 15.4*  NEUTROABS 9.4*  --   --   --   HGB 13.6 12.1* 13.0 12.6*  HCT 42.0 36.3*  39.0 38.2*  MCV 97.9 97.1 95.8 96.2  PLT 186 158 172 203    Cardiac Enzymes: Recent Labs  Lab 05/21/18 0938 05/26/18 1058  CKTOTAL  --  24*  TROPONINI <0.03  --     BNP: Invalid input(s): POCBNP  CBG: Recent Labs  Lab 05/21/18 0929  GLUCAP 91    Microbiology: Results for orders placed or performed during the hospital encounter of 05/21/18  Blood culture (routine x 2)     Status: None   Collection Time: 05/21/18  9:31 AM  Result Value Ref Range Status   Specimen Description BLOOD LEFT ARM  Final   Special Requests   Final    BOTTLES DRAWN AEROBIC AND ANAEROBIC Blood Culture adequate volume   Culture   Final    NO GROWTH 5 DAYS Performed at [facility], [address] [place], [place] [address]    Report Status 05/26/2018 FINAL  Final  Blood culture (routine x 2)     Status: None   Collection Time: 05/21/18  9:31 AM  Result Value Ref Range Status   Specimen Description BLOOD LEFT ARM  Final   Special Requests   Final    BOTTLES DRAWN AEROBIC  AND ANAEROBIC Blood Culture adequate volume   Culture   Final    NO GROWTH 5 DAYS Performed at [facility], [address] [place], [place] [address]    Report Status 05/26/2018 FINAL  Final  Expectorated sputum assessment w rflx to resp cult     Status: None   Collection Time: 05/21/18 11:58 AM  Result Value Ref Range Status   Specimen Description SPUTUM  Final   Special Requests Normal  Final   Sputum evaluation   Final    THIS SPECIMEN IS ACCEPTABLE FOR SPUTUM CULTURE Performed at [facility], [address], [place], [place] [address]    Report Status 05/21/2018 FINAL  Final  Culture, respiratory     Status: None   Collection Time: 05/21/18 11:58 AM  Result Value Ref Range Status   Specimen Description   Final    SPUTUM Performed at [facility], [address] [place], [place] [address]    Special Requests   Final    Normal Reflexed from [phone] Performed at  [facility], [address] [place], [place] [address]    Gram Stain   Final    MODERATE WBC PRESENT, PREDOMINANTLY PMN FEW SQUAMOUS EPITHELIAL CELLS PRESENT ABUNDANT GRAM POSITIVE COCCI ABUNDANT GRAM NEGATIVE RODS ABUNDANT GRAM POSITIVE RODS    Culture   Final    ABUNDANT Consistent with normal respiratory flora. Performed at [facility], [address] [place], [place] [address]    Report Status 05/24/2018 FINAL  Final  MRSA PCR Screening     Status: None   Collection Time: 05/21/18 12:28 PM  Result Value Ref Range Status   MRSA by PCR NEGATIVE NEGATIVE Final    Comment:        The GeneXpert MRSA Assay (FDA approved for NASAL specimens only), is one component of a comprehensive MRSA colonization surveillance program. It is not intended to diagnose MRSA infection nor to guide or monitor treatment for MRSA infections. Performed at [facility], [address] [place], [place] [address]   Gastrointestinal Panel by PCR , Stool     Status: None   Collection Time: 05/25/18  1:24 PM  Result Value Ref Range Status   Campylobacter species NOT DETECTED NOT DETECTED Final   Plesimonas shigelloides NOT DETECTED NOT DETECTED Final   Salmonella species NOT DETECTED NOT DETECTED Final   Yersinia enterocolitica NOT DETECTED NOT DETECTED Final   Vibrio species NOT DETECTED NOT DETECTED Final   Vibrio cholerae NOT DETECTED NOT DETECTED Final   Enteroaggregative E coli (EAEC) NOT DETECTED NOT DETECTED Final   Enteropathogenic E coli (EPEC) NOT DETECTED NOT DETECTED Final   Enterotoxigenic E coli (ETEC) NOT DETECTED NOT DETECTED Final   Shiga like toxin producing E coli (STEC) NOT DETECTED NOT DETECTED Final   Shigella/Enteroinvasive E coli (EIEC) NOT DETECTED NOT DETECTED Final   Cryptosporidium NOT DETECTED NOT DETECTED Final   Cyclospora cayetanensis NOT DETECTED NOT DETECTED Final   Entamoeba histolytica NOT DETECTED NOT DETECTED Final   Giardia lamblia  NOT DETECTED NOT DETECTED Final   Adenovirus F40/41 NOT DETECTED NOT DETECTED Final   Astrovirus NOT DETECTED NOT DETECTED Final   Norovirus GI/GII NOT DETECTED NOT DETECTED Final   Rotavirus A NOT DETECTED NOT DETECTED Final   Sapovirus (I, II, IV, and V) NOT DETECTED NOT DETECTED Final    Comment: Performed at [facility], [address], [place], [place] [address]  C difficile quick scan  w PCR reflex     Status: None   Collection Time: 05/25/18  1:24 PM  Result Value Ref Range Status   C Diff antigen NEGATIVE NEGATIVE Final   C Diff toxin NEGATIVE NEGATIVE Final   C Diff interpretation No C. difficile detected.  Final    Comment: Performed at [facility], [address] [place], [place] [address]  CULTURE, BLOOD (ROUTINE X 2) w Reflex to ID Panel     Status: None (Preliminary result)   Collection Time: 05/25/18  2:59 PM  Result Value Ref Range Status   Specimen Description BLOOD LAC  Final   Special Requests   Final    BOTTLES DRAWN AEROBIC AND ANAEROBIC Blood Culture adequate volume   Culture   Final    NO GROWTH < 24 HOURS Performed at [facility], [address] [place], [place] [address]    Report Status PENDING  Incomplete  CULTURE, BLOOD (ROUTINE X 2) w Reflex to ID Panel     Status: None (Preliminary result)   Collection Time: 05/25/18  3:09 PM  Result Value Ref Range Status   Specimen Description BLOOD R HAND  Final   Special Requests   Final    BOTTLES DRAWN AEROBIC AND ANAEROBIC Blood Culture adequate volume   Culture   Final    NO GROWTH < 24 HOURS Performed at [facility], [address] [place], [place] [address]    Report Status PENDING  Incomplete    Coagulation Studies: No results for input(s): LABPROT, INR in the last 72 hours.  Urinalysis:  Recent Labs  Lab 05/21/18 0931 05/25/18 2103  COLORURINE AMBER* AMBER*  LABSPEC 1.025 1.039*  PHURINE 5.0 5.0  GLUCOSEU NEGATIVE NEGATIVE  HGBUR SMALL* NEGATIVE   BILIRUBINUR NEGATIVE SMALL*  KETONESUR 5* 5*  PROTEINUR NEGATIVE 100*  NITRITE NEGATIVE NEGATIVE  LEUKOCYTESUR NEGATIVE NEGATIVE    Lipid Panel:     Component Value Date/Time   CHOL 201 (H) 02/09/2017 1716   TRIG 92.0 02/09/2017 1716   HDL 52.90 02/09/2017 1716   CHOLHDL 4 02/09/2017 1716   VLDL 18.4 02/09/2017 1716   LDLCALC 129 (H) 02/09/2017 1716    HgbA1C: No results found for: HGBA1C  Urine Drug Screen:  No results found for: LABOPIA, COCAINSCRNUR, LABBENZ, AMPHETMU, THCU, LABBARB  Alcohol Level: No results for input(s): ETH in the last 168 hours.  Other results: EKG: normal EKG, normal sinus rhythm, unchanged from previous tracings.  Imaging: Dg Chest Port 1 View  Result Date: 107/03/2018 CLINICAL DATA:  Fever of unknown origin. EXAM: PORTABLE CHEST 1 VIEW COMPARISON:  05/21/2018. FINDINGS: Mediastinum and hilar structures are normal. Heart size stable. Right base atelectasis/infiltrate. No pleural effusion or pneumothorax. Thoracic spine scoliosis and degenerative change. IMPRESSION: Right base atelectasis/infiltrate. Pneumonia could present this fashion. Followup PA and lateral chest X-ray is recommended in 3-4 weeks following trial of antibiotic therapy to ensure resolution and exclude underlying malignancy. Electronically Signed   By: Maisie Fus  Register   On: 107/03/2018 13:11     Assessment/Plan:  72 y.o. male with hx of dementia presented with altered mental status few days ago. Pt has been noted to have increased fevers and somnolence. - Pt has baseline of dementia and patient has been worsening in terms of mental status in the past few days along with fevers. - There is suspected aspiration PNA that is being addressed by primary team.  - on examination patient does have neck stiffness and increased rigidity - CK  level of 30 so not likely to be NMS - would treat as meningitis prophylactic and obtain LP when possible  - d/w with pt's wife and primary team.  - Will  d/c lexapro/haldol/risperidol - IVF - Thyroid panel pending   05/26/2018, 1:51 PM

## 2018-05-26 NOTE — Progress Notes (Signed)
At approximately 2000, Bryan Berry stated at bedside that she thinks husband "is not doing well". Bryan Berry reports to night nurse she has been unable to arouse pnt enough to eat since this afternoon. Bryan Berry spoke at length about pnts health history. Discussed with Mrs. Bryan Berry the code status which she says she was unaware it was still a full code. Bryan Berry said that when she came into hospital she mentioned it to staff but just assumed they had put it in the system. Explained the difference between full code and DNR to be sure she was aware of the difference. Bryan Berry said her and husband discussed this several times and he did not want to be a full code. Notified MD (Dr. Anne HahnWillis) of Bryan Berry's request. Order was put in for DNR.

## 2018-05-26 NOTE — Progress Notes (Signed)
[facility] [place] at [facility]      PATIENT NAME: Bryan Berry    MR#:  [id number]  DATE OF BIRTH:  1946/02/18  SUBJECTIVE:   Remains lethargic and encephalopathic.  Had a fever of 101 yesterday and a low-grade fever again today.  Urinalysis was negative chest x-ray was suggestive of possible aspiration pneumonia.  Patient also had some diarrhea but stool for C. difficile and comprehensive culture is negative.  Patient's wife is at bedside.  REVIEW OF SYSTEMS:    Review of Systems  Unable to perform ROS: Dementia    Nutrition: Dysphagia 1 Tolerating Diet: Yes Tolerating PT: Eval noted.   DRUG ALLERGIES:  No Known Allergies  VITALS:  Blood pressure 114/64, pulse 88, temperature 98.9 F (37.2 C), temperature source Oral, resp. rate 16, weight 88 kg, SpO2 97 %.  PHYSICAL EXAMINATION:   Physical Exam  GENERAL:  72 y.o.-year-old patient lying in bed lethargic/Encephalopathic.   EYES: Pupils equal, round, reactive to light. No scleral icterus.   HEENT: Head atraumatic, normocephalic. Oropharynx and nasopharynx clear.  NECK:  Supple, no jugular venous distention. No thyroid enlargement, no tenderness.  LUNGS: Normal breath sounds bilaterally, no wheezing, rales, rhonchi. No use of accessory muscles of respiration.  CARDIOVASCULAR: S1, S2 normal. No murmurs, rubs, or gallops.  ABDOMEN: Soft, nontender, nondistended. Bowel sounds present. No organomegaly or mass.  EXTREMITIES: No cyanosis, clubbing or edema b/l.    NEUROLOGIC: Cranial nerves II through XII are intact. No focal Motor or sensory deficits b/l.  Globally weak. Head turned to right side with some rigidity.   PSYCHIATRIC: The patient is alert and oriented x 1. Encephalopathic.  SKIN: No obvious rash, lesion, or ulcer.    LABORATORY PANEL:   CBC Recent Labs  Lab 05/26/18 1058  WBC 15.4*  HGB 12.6*  HCT 38.2*  PLT 203    ------------------------------------------------------------------------------------------------------------------  Chemistries  Recent Labs  Lab 05/21/18 0938  05/24/18 0358  NA 136   < > 141  K 3.9   < > 3.6  CL 102   < > 105  CO2 25   < > 26  GLUCOSE 97   < > 124*  BUN 25*   < > 12  CREATININE 0.96   < > 0.62  CALCIUM 8.9   < > 8.5*  AST 21  --   --   ALT 19  --   --   ALKPHOS 104  --   --   BILITOT 1.3*  --   --    < > = values in this interval not displayed.   ------------------------------------------------------------------------------------------------------------------  Cardiac Enzymes Recent Labs  Lab 05/21/18 0938  TROPONINI <0.03   ------------------------------------------------------------------------------------------------------------------  RADIOLOGY:  Dg Chest Port 1 View  Result Date: 1February 04, 202019 CLINICAL DATA:  Fever of unknown origin. EXAM: PORTABLE CHEST 1 VIEW COMPARISON:  05/21/2018. FINDINGS: Mediastinum and hilar structures are normal. Heart size stable. Right base atelectasis/infiltrate. No pleural effusion or pneumothorax. Thoracic spine scoliosis and degenerative change. IMPRESSION: Right base atelectasis/infiltrate. Pneumonia could present this fashion. Followup PA and lateral chest X-ray is recommended in 3-4 weeks following trial of antibiotic therapy to ensure resolution and exclude underlying malignancy. Electronically Signed   By: Maisie Fus  Register   On: 1February 04, 202019 13:11     ASSESSMENT AND PLAN:   72 year old male with history of dementia who presents due to altered mental status.  *Acute metabolic encephalopathy -Initially suspected to be secondary to worsening dementia combined with underlying aspiration pneumonia.  CT head, MRI of the brain were negative for acute pathology.  He was apparently ambulatory and more functional about 2 weeks ago. - Given patient's fever and encephalopathy there is possible concern for underlying  meningitis/encephalitis.  I discussed this with neurologist over the phone who has evaluated the patient.  We will empirically start the patient on treatment for meningitis/viral encephalitis and follow. - We will attempt to get fluoroscopy guided lumbar puncture on Monday if patient is not improving.  * Fever of unknown origin - fever has improved and had a low grade fever of 99 today.  - BC remain (-), UA was negative and CXR showed ?? Aspiration pneumonia but pt. Was already on Augmentin and patient developed a fever.  There is some concern for possible underlying meningitis/encephalitis given the fact that patient's mental status is significantly worse than his baseline from 2 weeks ago. - I will empirically start the patient on vancomycin, ceftriaxone, ampicillin, acyclovir to cover for bacterial meningitis/viral encephalitis.  Will get fluoroscopy guided lumbar puncture on Monday if possible.  Discussed with interventional radiology.  *Chronic dementia - cont. Namenda, Aricept, Risperdal.  - avoid Benzo's.   *Acute suspected aspiration pneumonia -Seen by speech therapy and placed on a pured diet with nectar thick liquids.   - cont. Empiric abx as mentioned above.   Discussed plan of care with pt's wife and also Neurology.    All the records are reviewed and case discussed with Care Management/Social Worker. Management plans discussed with the patient, family and they are in agreement.  CODE STATUS: Full code  DVT Prophylaxis: Lovenox  TOTAL TIME TAKING CARE OF THIS PATIENT: 40 minutes.   POSSIBLE D/C IN 2-3 DAYS, DEPENDING ON CLINICAL CONDITION.   Houston SirenSAINANI, J M.D on 05/26/2018 at 12:50 PM  Between 7am to 6pm - Pager - [phone]  After 6pm go to [email or web address] - Social research officer, governmentpassword EPAS [email or web address]  [facility] [place] [facility]  Office  [phone]  CC: Primary care physician; Joaquim Namuncan, Graham S, MD

## 2018-05-26 NOTE — Progress Notes (Signed)
Pharmacy Antibiotic Note  Bryan ShellRobert Berry is a 72 y.o. male admitted on 05/21/2018 with meningitis.  Pharmacy has been consulted for vancomycin dosing.  Plan: Patient being started on ceftriaxone, ampicillin, vancomycin, acyclovir for empiric meninigits/ r/o HSV encephalitis treatment. Pharmacy consulted to dose vancomycin.  Vancomycin 1.25 gm IV x 1 followed in approximately 9 hours (stacked dosing) by vancomycin 1.25 gm IV Q12H, predicted trough 16 mcg/ml. Pharmacy will continue to follow and adjust as needed to maintain trough 15 to 20 mcg/ml.   Vd 53.4 L, ke 0.078 hr-1, T1/2 8.9 hr  Weight: 194 lb (88 kg)  Temp (24hrs), Avg:99.9 F (37.7 C), Min:98.8 F (37.1 C), Max:101.1 F (38.4 C)  Recent Labs  Lab 05/21/18 0931 05/21/18 0938 05/22/18 0416 05/24/18 0358 05/25/18 2046 05/26/18 1058  WBC  --  11.3* 8.1 7.4  --  15.4*  CREATININE  --  0.96 1.01 0.62  --   --   LATICACIDVEN 1.1  --   --   --  0.9  --     Estimated Creatinine Clearance: 88.6 mL/min (by C-G formula based on SCr of 0.62 mg/dL).    No Known Allergies  Antimicrobials this admission:   Dose adjustments this admission:   Microbiology results:  BCx:   UCx:    Sputum:    MRSA PCR:   Thank you for allowing pharmacy to be a part of this patient's care.  Carola FrostNathan A Yui Mulvaney, Pharm.D., BCPS Clinical Pharmacist 05/26/2018 12:05 PM

## 2018-05-27 LAB — CBC
HCT: 35.9 % — ABNORMAL LOW (ref 39.0–52.0)
Hemoglobin: 11.9 g/dL — ABNORMAL LOW (ref 13.0–17.0)
MCH: 32.3 pg (ref 26.0–34.0)
MCHC: 33.1 g/dL (ref 30.0–36.0)
MCV: 97.6 fL (ref 80.0–100.0)
Platelets: 211 10*3/uL (ref 150–400)
RBC: 3.68 MIL/uL — ABNORMAL LOW (ref 4.22–5.81)
RDW: 13.3 % (ref 11.5–15.5)
WBC: 13.9 10*3/uL — ABNORMAL HIGH (ref 4.0–10.5)
nRBC: 0 % (ref 0.0–0.2)

## 2018-05-27 MED ORDER — SODIUM CHLORIDE 0.9 % IV SOLN
INTRAVENOUS | Status: DC | PRN
  Administered 2018-05-27: 23:00:00 250 mL via INTRAVENOUS

## 2018-05-27 MED ORDER — SODIUM CHLORIDE 0.9 % IV SOLN
INTRAVENOUS | Status: DC
  Administered 2018-05-27 – 2018-05-28 (×2): via INTRAVENOUS

## 2018-05-27 NOTE — Progress Notes (Signed)
[facility] [place] [facility]      PATIENT NAME: Bryan Berry    MR#:  [id number]  DATE OF BIRTH:  05-15-1946  SUBJECTIVE:   Remains lethargic and encephalopathic.  Had a fever of 100.7 yesterday.   REVIEW OF SYSTEMS:    Review of Systems  Unable to perform ROS: Dementia    Nutrition: Dysphagia 1 Tolerating Diet: Yes Tolerating PT: Eval noted.   DRUG ALLERGIES:  No Known Allergies  VITALS:  Blood pressure 124/82, pulse 91, temperature 99.2 F (37.3 C), temperature source Oral, resp. rate (!) 22, height 5\' 9"  (1.753 m), weight 80.2 kg, SpO2 95 %.  PHYSICAL EXAMINATION:   Physical Exam  GENERAL:  72 y.o.-year-old patient lying in bed lethargic/Encephalopathic.   EYES: Pupils equal, round, reactive to light. No scleral icterus.   HEENT: Head atraumatic, normocephalic. Oropharynx and nasopharynx clear.  NECK:  Supple, no jugular venous distention. No thyroid enlargement, no tenderness.  LUNGS: Normal breath sounds bilaterally, no wheezing, rales, rhonchi. No use of accessory muscles of respiration.  CARDIOVASCULAR: S1, S2 normal. No murmurs, rubs, or gallops.  ABDOMEN: Soft, nontender, nondistended. Bowel sounds present. No organomegaly or mass.  EXTREMITIES: No cyanosis, clubbing or edema b/l.    NEUROLOGIC: Cranial nerves II through XII are intact. No focal Motor or sensory deficits b/l.  Globally weak. Head turned to right side with some rigidity.   PSYCHIATRIC: The patient is alert and oriented x 1. Encephalopathic.  SKIN: No obvious rash, lesion, or ulcer.    LABORATORY PANEL:   CBC Recent Labs  Lab 05/27/18 0506  WBC 13.9*  HGB 11.9*  HCT 35.9*  PLT 211   ------------------------------------------------------------------------------------------------------------------  Chemistries  Recent Labs  Lab 05/21/18 0938  05/24/18 0358  NA 136   < > 141  K 3.9   < > 3.6  CL 102   < > 105  CO2 25   < > 26  GLUCOSE 97   < > 124*    BUN 25*   < > 12  CREATININE 0.96   < > 0.62  CALCIUM 8.9   < > 8.5*  AST 21  --   --   ALT 19  --   --   ALKPHOS 104  --   --   BILITOT 1.3*  --   --    < > = values in this interval not displayed.   ------------------------------------------------------------------------------------------------------------------  Cardiac Enzymes Recent Labs  Lab 05/21/18 0938  TROPONINI <0.03   ------------------------------------------------------------------------------------------------------------------  RADIOLOGY:  No results found.   ASSESSMENT AND PLAN:   72 year old male with history of dementia who presents due to altered mental status.  *Acute metabolic encephalopathy -Initially suspected to be secondary to worsening dementia combined with underlying aspiration pneumonia.  CT head, MRI of the brain were negative for acute pathology.  He was apparently ambulatory and more functional about 2 weeks ago. - Given patient's fever and encephalopathy there is concern for underlying meningitis/encephalitis. Appreciate neurology input and patient has been empirically started on treatment for encephalitis/meningitis. -Continue IV ampicillin, ceftriaxone, vancomycin, ganciclovir. - We will attempt to get fluoroscopy guided lumbar puncture on Monday if patient is not improving.  * Fever of unknown origin - continues to have low grade fever.   - BC remain (-), UA was negative and CXR showed ?? Aspiration pneumonia but pt. Was already on Augmentin and patient developed a fever.  There is concern for possible underlying meningitis/encephalitis given the fact that patient's  mental status is significantly worse than his baseline from 2 weeks ago. - cont. Empiric vancomycin, ceftriaxone, ampicillin, gancyclovir to cover for bacterial meningitis/viral encephalitis.  Will get fluoroscopy guided lumbar puncture on Monday. Discussed with interventional radiology.  *Chronic dementia - cont. Namenda,  Aricept, Risperdal.  - avoid Benzo's.   *Acute suspected aspiration pneumonia -Seen by speech therapy and placed on a pured diet with nectar thick liquids.   - cont. Empiric abx as mentioned above.   Discussed plan of care with pt's wife and also Neurology.    All the records are reviewed and case discussed with Care Management/Social Worker. Management plans discussed with the patient, family and they are in agreement.  CODE STATUS: Full code  DVT Prophylaxis: Lovenox  TOTAL TIME TAKING CARE OF THIS PATIENT: 30 minutes.   POSSIBLE D/C IN 2-3 DAYS, DEPENDING ON CLINICAL CONDITION.   [place] SirenSAINANI,Lety Cullens J M.D on 05/27/2018 at 1:27 PM  Between 7am to 6pm - Pager - [phone]  After 6pm go to [email or web address] - Social research officer, governmentpassword EPAS [email or web address]  [facility] [place] [facility]  Office  [phone]  CC: Primary care physician; Joaquim Namuncan, Graham S, MD

## 2018-05-27 NOTE — Progress Notes (Signed)
Pharmacy consult for ganciclovir monitoring noted. Recommend monitoring CBC twice weekly and SCr at least weekly while on ganciclovir. Orders have been placed for CBC Monday/Thursday and SCr weekly on Mondays to start Monday 05/28/18.   Bryan Berry, [place]arm[place]., BCPS Clinical Pharmacist 05/27/2018 12:32

## 2018-05-27 NOTE — Progress Notes (Signed)
As per family periods of wakefulness yesterday.  Today still appears to be lethargic.  Past Medical History:  Diagnosis Date  . Allergy   . Dementia (HCC)   . Memory loss   . UTI (lower urinary tract infection)    one episode    Past Surgical History:  Procedure Laterality Date  . CATARACT EXTRACTION W/ INTRAOCULAR LENS  IMPLANT, BILATERAL     feb and march 2019  . VARICOSE VEIN SURGERY      Family History  Problem Relation Age of Onset  . Heart disease Father   . Colon cancer Neg Hx   . Prostate cancer Neg Hx     Social History:  reports that he has quit smoking. He has never used smokeless tobacco. He reports that he drinks alcohol. He reports that he does not use drugs.  No Known Allergies  Medications: I have reviewed the patient's current medications.  ROS: Unable to obtain due to confusion  Physical Examination: Blood pressure 124/82, pulse 91, temperature 99.2 F (37.3 C), temperature source Oral, resp. rate (!) 22, height 5\' 9"  (1.753 m), weight 80.2 kg, SpO2 95 %.  Pt is not verbal, not following commands Withdraws from pain bilaterally.    Laboratory Studies:   Basic Metabolic Panel: Recent Labs  Lab 05/21/18 0938 05/22/18 0416 05/24/18 0358  NA 136 139 141  K 3.9 3.6 3.6  CL 102 106 105  CO2 25 25 26   GLUCOSE 97 87 124*  BUN 25* 23 12  CREATININE 0.96 1.01 0.62  CALCIUM 8.9 8.2* 8.5*    Liver Function Tests: Recent Labs  Lab 05/21/18 0938  AST 21  ALT 19  ALKPHOS 104  BILITOT 1.3*  PROT 6.6  ALBUMIN 3.7   No results for input(s): LIPASE, AMYLASE in the last 168 hours. No results for input(s): AMMONIA in the last 168 hours.  CBC: Recent Labs  Lab 05/21/18 0938 05/22/18 0416 05/24/18 0358 05/26/18 1058 05/27/18 0506  WBC 11.3* 8.1 7.4 15.4* 13.9*  NEUTROABS 9.4*  --   --   --   --   HGB 13.6 12.1* 13.0 12.6* 11.9*  HCT 42.0 36.3* 39.0 38.2* 35.9*  MCV 97.9 97.1 95.8 96.2 97.6  PLT 186 158 172 203 211    Cardiac  Enzymes: Recent Labs  Lab 05/21/18 0938 05/26/18 1058  CKTOTAL  --  24*  TROPONINI <0.03  --     BNP: Invalid input(s): POCBNP  CBG: Recent Labs  Lab 05/21/18 0929  GLUCAP 91    Microbiology: Results for orders placed or performed during the hospital encounter of 05/21/18  Blood culture (routine x 2)     Status: None   Collection Time: 05/21/18  9:31 AM  Result Value Ref Range Status   Specimen Description BLOOD LEFT ARM  Final   Special Requests   Final    BOTTLES DRAWN AEROBIC AND ANAEROBIC Blood Culture adequate volume   Culture   Final    NO GROWTH 5 DAYS Performed at [facility], [address] [place], [place] [address]    Report Status 05/26/2018 FINAL  Final  Blood culture (routine x 2)     Status: None   Collection Time: 05/21/18  9:31 AM  Result Value Ref Range Status   Specimen Description BLOOD LEFT ARM  Final   Special Requests   Final    BOTTLES DRAWN AEROBIC AND ANAEROBIC Blood Culture adequate volume   Culture   Final    NO  GROWTH 5 DAYS Performed at [facility], [address] [place], [place] [address]    Report Status 05/26/2018 FINAL  Final  Expectorated sputum assessment w rflx to resp cult     Status: None   Collection Time: 05/21/18 11:58 AM  Result Value Ref Range Status   Specimen Description SPUTUM  Final   Special Requests Normal  Final   Sputum evaluation   Final    THIS SPECIMEN IS ACCEPTABLE FOR SPUTUM CULTURE Performed at [facility], [address], [place], [place] [address]    Report Status 05/21/2018 FINAL  Final  Culture, respiratory     Status: None   Collection Time: 05/21/18 11:58 AM  Result Value Ref Range Status   Specimen Description   Final    SPUTUM Performed at [facility], [address], [place], [place] [address]    Special Requests   Final    Normal Reflexed from [phone] Performed at [facility], [address] [place], [place] [address]    Gram  Stain   Final    MODERATE WBC PRESENT, PREDOMINANTLY PMN FEW SQUAMOUS EPITHELIAL CELLS PRESENT ABUNDANT GRAM POSITIVE COCCI ABUNDANT GRAM NEGATIVE RODS ABUNDANT GRAM POSITIVE RODS    Culture   Final    ABUNDANT Consistent with normal respiratory flora. Performed at [facility], [address], [place], [place] [address]    Report Status 05/24/2018 FINAL  Final  MRSA PCR Screening     Status: None   Collection Time: 05/21/18 12:28 PM  Result Value Ref Range Status   MRSA by PCR NEGATIVE NEGATIVE Final    Comment:        The GeneXpert MRSA Assay (FDA approved for NASAL specimens only), is one component of a comprehensive MRSA colonization surveillance program. It is not intended to diagnose MRSA infection nor to guide or monitor treatment for MRSA infections. Performed at [facility], [address] [place], [place] [address]   Gastrointestinal Panel by PCR , Stool     Status: None   Collection Time: 05/25/18  1:24 PM  Result Value Ref Range Status   Campylobacter species NOT DETECTED NOT DETECTED Final   Plesimonas shigelloides NOT DETECTED NOT DETECTED Final   Salmonella species NOT DETECTED NOT DETECTED Final   Yersinia enterocolitica NOT DETECTED NOT DETECTED Final   Vibrio species NOT DETECTED NOT DETECTED Final   Vibrio cholerae NOT DETECTED NOT DETECTED Final   Enteroaggregative E coli (EAEC) NOT DETECTED NOT DETECTED Final   Enteropathogenic E coli (EPEC) NOT DETECTED NOT DETECTED Final   Enterotoxigenic E coli (ETEC) NOT DETECTED NOT DETECTED Final   Shiga like toxin producing E coli (STEC) NOT DETECTED NOT DETECTED Final   Shigella/Enteroinvasive E coli (EIEC) NOT DETECTED NOT DETECTED Final   Cryptosporidium NOT DETECTED NOT DETECTED Final   Cyclospora cayetanensis NOT DETECTED NOT DETECTED Final   Entamoeba histolytica NOT DETECTED NOT DETECTED Final   Giardia lamblia NOT DETECTED NOT DETECTED Final   Adenovirus F40/41 NOT DETECTED NOT  DETECTED Final   Astrovirus NOT DETECTED NOT DETECTED Final   Norovirus GI/GII NOT DETECTED NOT DETECTED Final   Rotavirus A NOT DETECTED NOT DETECTED Final   Sapovirus (I, II, IV, and V) NOT DETECTED NOT DETECTED Final    Comment: Performed at [facility], [address] [place], [place] [address]  C difficile quick scan w PCR reflex     Status: None   Collection Time: 05/25/18  1:24  PM  Result Value Ref Range Status   C Diff antigen NEGATIVE NEGATIVE Final   C Diff toxin NEGATIVE NEGATIVE Final   C Diff interpretation No C. difficile detected.  Final    Comment: Performed at [facility], [address] [place], [place] [address]  CULTURE, BLOOD (ROUTINE X 2) w Reflex to ID Panel     Status: None (Preliminary result)   Collection Time: 05/25/18  2:59 PM  Result Value Ref Range Status   Specimen Description BLOOD LAC  Final   Special Requests   Final    BOTTLES DRAWN AEROBIC AND ANAEROBIC Blood Culture adequate volume   Culture   Final    NO GROWTH 2 DAYS Performed at [facility], [address] [place], [place] [address]    Report Status PENDING  Incomplete  CULTURE, BLOOD (ROUTINE X 2) w Reflex to ID Panel     Status: None (Preliminary result)   Collection Time: 05/25/18  3:09 PM  Result Value Ref Range Status   Specimen Description BLOOD R HAND  Final   Special Requests   Final    BOTTLES DRAWN AEROBIC AND ANAEROBIC Blood Culture adequate volume   Culture   Final    NO GROWTH 2 DAYS Performed at [facility], [address] [place], [place] [address]    Report Status PENDING  Incomplete    Coagulation Studies: No results for input(s): LABPROT, INR in the last 72 hours.  Urinalysis:  Recent Labs  Lab 05/21/18 0931 05/25/18 2103  COLORURINE AMBER* AMBER*  LABSPEC 1.025 1.039*  PHURINE 5.0 5.0  GLUCOSEU NEGATIVE NEGATIVE  HGBUR SMALL* NEGATIVE  BILIRUBINUR NEGATIVE SMALL*  KETONESUR 5* 5*  PROTEINUR NEGATIVE 100*   NITRITE NEGATIVE NEGATIVE  LEUKOCYTESUR NEGATIVE NEGATIVE    Lipid Panel:     Component Value Date/Time   CHOL 201 (H) 02/09/2017 1716   TRIG 92.0 02/09/2017 1716   HDL 52.90 02/09/2017 1716   CHOLHDL 4 02/09/2017 1716   VLDL 18.4 02/09/2017 1716   LDLCALC 129 (H) 02/09/2017 1716    HgbA1C: No results found for: HGBA1C  Urine Drug Screen:  No results found for: LABOPIA, COCAINSCRNUR, LABBENZ, AMPHETMU, THCU, LABBARB  Alcohol Level: No results for input(s): ETH in the last 168 hours.  Other results: EKG: normal EKG, normal sinus rhythm, unchanged from previous tracings.  Imaging: Dg Chest Port 1 View  Result Date: 1Jun 04, 202019 CLINICAL DATA:  Fever of unknown origin. EXAM: PORTABLE CHEST 1 VIEW COMPARISON:  05/21/2018. FINDINGS: Mediastinum and hilar structures are normal. Heart size stable. Right base atelectasis/infiltrate. No pleural effusion or pneumothorax. Thoracic spine scoliosis and degenerative change. IMPRESSION: Right base atelectasis/infiltrate. Pneumonia could present this fashion. Followup PA and lateral chest X-ray is recommended in 3-4 weeks following trial of antibiotic therapy to ensure resolution and exclude underlying malignancy. Electronically Signed   By: Maisie Fus  Register   On: 1Jun 04, 202019 13:11     Assessment/Plan:  72 y.o. male with hx of dementia presented with altered mental status few days ago. Pt has been noted to have increased fevers and somnolence. - Pt has baseline of dementia and patient has been worsening in terms of mental status in the past few days along with fevers. - s/p d/c lexapro/haldol/risperidol - on meningitis prophylaxis/antibiotics - LP tomorrow.  - TSH within normal range.    05/27/2018, 12:27 PM

## 2018-05-27 NOTE — Progress Notes (Signed)
   05/27/18 1603  Clinical Encounter Type  Visited With Patient and family together  Visit Type Follow-up  Referral From Nurse  Consult/Referral To Chaplain  Spiritual Encounters  Spiritual Needs Prayer;Emotional;Grief support  CH was paged to visit pt and his wife in room 117-A. Upon entering room, patient was resting in bed. Eyes remained closed throughout visit. Did not respond to verbal stimuli. Spouse Kara Meadmma was present. Tearful throughout visit. CH provided pastoral care through active/reflective listening, being a hopeful presence and praying with family when requested. Spouse requested for follow up visits. Pastoral visit was appreciated.

## 2018-05-28 ENCOUNTER — Inpatient Hospital Stay: Payer: Medicare Other

## 2018-05-28 LAB — CBC
HCT: 34.4 % — ABNORMAL LOW (ref 39.0–52.0)
Hemoglobin: 11.3 g/dL — ABNORMAL LOW (ref 13.0–17.0)
MCH: 31.7 pg (ref 26.0–34.0)
MCHC: 32.8 g/dL (ref 30.0–36.0)
MCV: 96.6 fL (ref 80.0–100.0)
Platelets: 226 10*3/uL (ref 150–400)
RBC: 3.56 MIL/uL — ABNORMAL LOW (ref 4.22–5.81)
RDW: 13.3 % (ref 11.5–15.5)
WBC: 15 10*3/uL — ABNORMAL HIGH (ref 4.0–10.5)
nRBC: 0 % (ref 0.0–0.2)

## 2018-05-28 LAB — PROTEIN, CSF: Total  Protein, CSF: 21 mg/dL (ref 15–45)

## 2018-05-28 LAB — CSF CELL COUNT WITH DIFFERENTIAL
EOS CSF: 0 %
Lymphs, CSF: 50 %
Monocyte-Macrophage-Spinal Fluid: 50 %
Other Cells, CSF: 0
RBC Count, CSF: 2 /mm3 (ref 0–3)
Segmented Neutrophils-CSF: 0 %
TUBE #: 3
WBC, CSF: 4 /mm3 (ref 0–5)

## 2018-05-28 LAB — CREATININE, SERUM
CREATININE: 0.63 mg/dL (ref 0.61–1.24)
Creatinine, Ser: 0.57 mg/dL — ABNORMAL LOW (ref 0.61–1.24)
GFR calc Af Amer: >60 mL/min (ref >60)
GFR calc Af Amer: >60 mL/min (ref >60)
GFR calc non Af Amer: >60 mL/min (ref >60)
GFR calc non Af Amer: >60 mL/min (ref >60)

## 2018-05-28 LAB — VANCOMYCIN, TROUGH: Vancomycin Tr: 12 ug/mL — ABNORMAL LOW (ref 15–20)

## 2018-05-28 LAB — T3, FREE: T3, Free: 1.8 pg/mL — ABNORMAL LOW (ref 2.0–4.4)

## 2018-05-28 LAB — GLUCOSE, CSF: Glucose, CSF: 63 mg/dL (ref 40–70)

## 2018-05-28 MED ORDER — HALOPERIDOL LACTATE 5 MG/ML IJ SOLN: 2.0000 mg | Freq: Four times a day (QID) | INTRAMUSCULAR | Status: DC | PRN

## 2018-05-28 MED ORDER — VANCOMYCIN HCL IN DEXTROSE 1-5 GM/200ML-% IV SOLN
1000.0000 mg | Freq: Three times a day (TID) | INTRAVENOUS | Status: DC
  Filled 2018-05-28 (×2): qty 200

## 2018-05-28 MED ORDER — LIDOCAINE HCL (PF) 1 % IJ SOLN
5.0000 mL | Freq: Once | INTRAMUSCULAR | Status: AC
  Administered 2018-05-28: 09:00:00 5 mL

## 2018-05-28 NOTE — Progress Notes (Signed)
[facility] [place] at [facility]      PATIENT NAME: Bryan Berry    MR#:  [id number]  DATE OF BIRTH:  06/08/1946  SUBJECTIVE:   Patient remains quite lethargic and encephalopathic.  No fever overnight.  Underwent fluoroscopy guided lumbar puncture today and fluid analysis negative for meningitis/encephalitis.  REVIEW OF SYSTEMS:    Review of Systems  Unable to perform ROS: Dementia    Nutrition: Dysphagia 1 Tolerating Diet: Yes Tolerating PT: Eval noted.   DRUG ALLERGIES:  No Known Allergies  VITALS:  Blood pressure 137/87, pulse 77, temperature 97.6 F (36.4 C), temperature source Axillary, resp. rate (!) 22, height 5\' 9"  (1.753 m), weight 80.2 kg, SpO2 97 %.  PHYSICAL EXAMINATION:   Physical Exam  GENERAL:  72 y.o.-year-old patient lying in bed lethargic/Encephalopathic.   EYES: Pupils equal, round, reactive to light. No scleral icterus.   HEENT: Head atraumatic, normocephalic. Oropharynx and nasopharynx clear. Dry Oral Mucosa.  NECK:  Supple, no jugular venous distention. No thyroid enlargement, no tenderness.  LUNGS: Normal breath sounds bilaterally, no wheezing, rales, rhonchi. No use of accessory muscles of respiration.  CARDIOVASCULAR: S1, S2 normal. No murmurs, rubs, or gallops.  ABDOMEN: Soft, nontender, nondistended. Bowel sounds present. No organomegaly or mass.  EXTREMITIES: No cyanosis, clubbing or edema b/l.    NEUROLOGIC: Cranial nerves II through XII are intact. No focal Motor or sensory deficits b/l.  Globally weak. Head turned to right side with some rigidity.   PSYCHIATRIC: The patient is alert and oriented x 1. Encephalopathic.  SKIN: No obvious rash, lesion, or ulcer.    LABORATORY PANEL:   CBC Recent Labs  Lab 05/28/18 0543  WBC 15.0*  HGB 11.3*  HCT 34.4*  PLT 226   ------------------------------------------------------------------------------------------------------------------  Chemistries  Recent Labs  Lab  05/24/18 0358 05/28/18 0543  NA 141  --   K 3.6  --   CL 105  --   CO2 26  --   GLUCOSE 124*  --   BUN 12  --   CREATININE 0.62 0.63  CALCIUM 8.5*  --    ------------------------------------------------------------------------------------------------------------------  Cardiac Enzymes No results for input(s): TROPONINI in the last 168 hours. ------------------------------------------------------------------------------------------------------------------  RADIOLOGY:  Dg Fluoro Guided Loc Of Needle/cath Tip For Spinal Inject Lt  Result Date: 05/28/2018 CLINICAL DATA:  Fever of unknown origin EXAM: DIAGNOSTIC LUMBAR PUNCTURE UNDER FLUOROSCOPIC GUIDANCE FLUOROSCOPY TIME:  Fluoroscopy Time:  1 minutes 18 seconds Radiation Exposure Index (if provided by the fluoroscopic device): 45 mGy Number of Acquired Spot Images: 1 PROCEDURE: Informed consent was obtained from the patient's wife prior to the procedure, including potential complications of headache, allergy, and pain. With the patient prone, the lower back was prepped with Betadine. 1% Lidocaine was used for local anesthesia. Lumbar puncture was performed at the L4 level using a 20 gauge needle with return of clear CSF with an opening pressure of 17 cm water. Thirteen ml of CSF were obtained for laboratory studies. The patient tolerated the procedure well and there were no apparent complications. IMPRESSION: Successful fluoroscopic lumbar puncture as described. Electronically Signed   By: Alcide Clever M.D.   On: 05/28/2018 09:01     ASSESSMENT AND PLAN:   72 year old male with history of dementia who presents due to altered mental status.  *Acute metabolic encephalopathy -Initially suspected to be secondary to worsening dementia combined with underlying aspiration pneumonia.  CT head, MRI of the brain were negative for acute pathology.  He was apparently  ambulatory and more functional about 2 weeks ago. - Given patient's fever and  encephalopathy there is concern for underlying meningitis/encephalitis and therefore empirically patient was started on IV antibiotics and antivirals.  Despite being on those medications patient's mental status is not improved.  Underwent lumbar puncture today and the fluid analysis are negative for encephalitis/meningitis.  Antibiotics have been discontinued. - Mental status has not improved therefore this is likely secondary to worsening dementia and encephalopathy unlikely from other infectious pathology.  Discussed with neurology and they are in agreement.   * Fever of unknown origin - fever has resolved.  -Patient's blood cultures remain negative. UA (-), CXR was suggestive of aspiration a few days back.  I suspect this is secondary to microaspiration/aspiration pneumonia and unlikely related to meningitis/encephalitis.  Patient's lumbar puncture with spinal fluid analysis is negative for encephalitis or meningitis.  *Chronic dementia - cont. Namenda, Aricept, Risperdal.  -Patient's mental status has not improved and he continues to be lethargic/encephalopathic with poor p.o. intake.  He has not improved despite being on broad-spectrum empiric antibiotics/antivirals.  This is secondary to patient's worsening dementia.  Prognosis is poor to guarded given patient's no cooperation with physical therapy and poor p.o. intake.   Discussed plan of care with patient's wife at bedside as patient has not improved despite aggressive treatment with broad-spectrum IV antibiotics and supportive care.  Fluid analysis is negative for meningitis/encephalitis.  Patient likely has progressive dementia with worsening decline.  He has poor p.o. intake/failure to thrive and would benefit from hospice services.  Patient's wife is in agreement.  She is considering hospice at home.  All the records are reviewed and case discussed with Care Management/Social Worker. Management plans discussed with the patient, family and  they are in agreement.  CODE STATUS: Full code  DVT Prophylaxis: Lovenox  TOTAL TIME TAKING CARE OF THIS PATIENT: 35 minutes.   POSSIBLE D/C IN 1-2 DAYS, DEPENDING ON CLINICAL CONDITION.   Houston SirenSAINANI,VIVEK J M.D on 05/28/2018 at 1:31 PM  Between 7am to 6pm - Pager - [phone]  After 6pm go to [email or web address] - Social research officer, governmentpassword EPAS [facility]  [facility] [place] [facility]  Office  [phone]  CC: Primary care physician; Joaquim Namuncan, Graham S, MD

## 2018-05-28 NOTE — Procedures (Signed)
Lumbar puncture without difficulty.   Clear CSF obtained and sent to lab  Complications:  None  Blood Loss: none  See dictation in canopy pacs

## 2018-05-28 NOTE — Progress Notes (Signed)
Pharmacy Antibiotic Note  Bryan ShellRobert Berry is a 72 y.o. male admitted on 05/21/2018 with possible meningitis.  Pharmacy has been consulted for vancomycin dosing. Patient is receiving  started on ceftriaxone, ampicillin, vancomycin, ganciclovir for empiric meninigits/ r/o HSV encephalitis treatment.   Patient started on vancomycin 1250 mg IV every 12 hours 11/30  Plan: 12/02 0930 VT: 12. Will adjust vancomycin regimen to Vancomycin 1g IV every 8 hours. Next trough level prior to 4th dose.   New Kinetics: Vd 56 L, ke 0.095 hr-1, T1/2 7.3 hr  Height: 5\' 9"  (175.3 cm) Weight: 176 lb 12.9 oz (80.2 kg) IBW/kg (Calculated) : 70.7  Temp (24hrs), Avg:98.8 F (37.1 C), Min:97.6 F (36.4 C), Max:99.8 F (37.7 C)  Recent Labs  Lab 05/22/18 0416 05/24/18 0358 05/25/18 2046 05/26/18 1058 05/27/18 0506 05/28/18 0543 05/28/18 0924  WBC 8.1 7.4  --  15.4* 13.9* 15.0*  --   CREATININE 1.01 0.62  --   --   --  0.63  --   LATICACIDVEN  --   --  0.9  --   --   --   --   VANCOTROUGH  --   --   --   --   --   --  12*    Estimated Creatinine Clearance: 82.2 mL/min (by C-G formula based on SCr of 0.63 mg/dL).    No Known Allergies  Antimicrobials this admission: 11/25 Unasyn >> 11/30 11/30 Vancomycin>> 11/30 Ceftriaxone>> 11/30 ampicillin>>  11/30 ganciclovir>>  Dose adjustments this admission: 12/2 vancomycin adjusted from 1250mg  q12h to 1000mg  q8hr  Microbiology results:  BCx: NG TD  MRSA PCR: negative   LP: pending   Thank you for allowing pharmacy to be a part of this patient's care.  Bryan Berry M Bryan Berry, Pharm.D., BCPS Clinical Pharmacist 05/28/2018 10:19 AM

## 2018-05-28 NOTE — Progress Notes (Signed)
Subjective: Patient remains altered.  Unable to follow commands.  Speech is incomprehensible. Patient's wife is at bedside report that patient is still not at baseline.  Objective: Current vital signs: BP (!) 148/92 (BP Location: Left Arm)   Pulse 86   Temp 98.7 F (37.1 C) (Oral)   Resp 16   Ht 5\' 9"  (1.753 m)   Wt 80.2 kg   SpO2 95%   BMI 26.11 kg/m  Vital signs in last 24 hours: Temp:  [97.6 F (36.4 C)-99 F (37.2 C)] 98.7 F (37.1 C) (12/02 1334) Pulse Rate:  [77-93] 86 (12/02 1334) Resp:  [16] 16 (12/02 1334) BP: (137-154)/(83-92) 148/92 (12/02 1334) SpO2:  [94 %-97 %] 95 % (12/02 1334)  Intake/Output from previous day: 12/01 0701 - 12/02 0700 In: 2285.7 [I.V.:428.3; IV Piggyback:1857.3] Out: 275 [Urine:275] Intake/Output this shift: No intake/output data recorded. Nutritional status:  Diet Order            Diet general        DIET - DYS 1 Room service appropriate? Yes with Assist; Fluid consistency: Nectar Thick  Diet effective now              Neurological Exam   Mental Status: Lethargic and confused.  Speech mumbled and incomprehensible.  unable to follow 3 step  Cranial Nerves: II: Discs flat bilaterally; blinks to visual threats. Pupils equal, round, reactive to light and accommodation III,IV, VI: ptosis not present, extra-ocular motions intact bilaterally V,VII: Unable to assess VIII: hearing normal bilaterally IX,X: gag reflex deffered XI: Unable to asses XII: midline tongue extension Motor: Moves bilateral upper and lower extremities spontaneously.  Able to break gravity bilaterally. Tone and bulk:normal tone throughout; no atrophy noted Sensory: Pinprick and light touch intact bilaterally Deep Tendon Reflexes: 2+ and symmetric throughout Plantars: Right: mute                              Left: mute Cerebellar: Unable to assess Gait: not tested due to safety concerns  Lab Results: Basic Metabolic Panel: Recent Labs  Lab 05/22/18 0416  05/24/18 0358 05/28/18 0543 05/28/18 1319  NA 139 141  --   --   K 3.6 3.6  --   --   CL 106 105  --   --   CO2 25 26  --   --   GLUCOSE 87 124*  --   --   BUN 23 12  --   --   CREATININE 1.01 0.62 0.63 0.57*  CALCIUM 8.2* 8.5*  --   --     Liver Function Tests: No results for input(s): AST, ALT, ALKPHOS, BILITOT, PROT, ALBUMIN in the last 168 hours. No results for input(s): LIPASE, AMYLASE in the last 168 hours. No results for input(s): AMMONIA in the last 168 hours.  CBC: Recent Labs  Lab 05/22/18 0416 05/24/18 0358 05/26/18 1058 05/27/18 0506 05/28/18 0543  WBC 8.1 7.4 15.4* 13.9* 15.0*  HGB 12.1* 13.0 12.6* 11.9* 11.3*  HCT 36.3* 39.0 38.2* 35.9* 34.4*  MCV 97.1 95.8 96.2 97.6 96.6  PLT 158 172 203 211 226    Cardiac Enzymes: Recent Labs  Lab 05/26/18 1058  CKTOTAL 24*    Lipid Panel: No results for input(s): CHOL, TRIG, HDL, CHOLHDL, VLDL, LDLCALC in the last 168 hours.  CBG: No results for input(s): GLUCAP in the last 168 hours.  Microbiology: Results for orders placed or performed during the hospital encounter  of 05/21/18  Blood culture (routine x 2)     Status: None   Collection Time: 05/21/18  9:31 AM  Result Value Ref Range Status   Specimen Description BLOOD LEFT ARM  Final   Special Requests   Final    BOTTLES DRAWN AEROBIC AND ANAEROBIC Blood Culture adequate volume   Culture   Final    NO GROWTH 5 DAYS Performed at [facility], [address] [place], [place] [address]    Report Status 05/26/2018 FINAL  Final  Blood culture (routine x 2)     Status: None   Collection Time: 05/21/18  9:31 AM  Result Value Ref Range Status   Specimen Description BLOOD LEFT ARM  Final   Special Requests   Final    BOTTLES DRAWN AEROBIC AND ANAEROBIC Blood Culture adequate volume   Culture   Final    NO GROWTH 5 DAYS Performed at [facility], [address], [place], [place] [address]    Report Status 05/26/2018 FINAL  Final   Expectorated sputum assessment w rflx to resp cult     Status: None   Collection Time: 05/21/18 11:58 AM  Result Value Ref Range Status   Specimen Description SPUTUM  Final   Special Requests Normal  Final   Sputum evaluation   Final    THIS SPECIMEN IS ACCEPTABLE FOR SPUTUM CULTURE Performed at [facility], [address] [place] [address] [place], [place] [address]    Report Status 05/21/2018 FINAL  Final  Culture, respiratory     Status: None   Collection Time: 05/21/18 11:58 AM  Result Value Ref Range Status   Specimen Description   Final    SPUTUM Performed at [facility], [address] [place], [place] [address]    Special Requests   Final    Normal Reflexed from [phone] Performed at [facility], [address] [place], [place] [address]    Gram Stain   Final    MODERATE WBC PRESENT, PREDOMINANTLY PMN FEW SQUAMOUS EPITHELIAL CELLS PRESENT ABUNDANT GRAM POSITIVE COCCI ABUNDANT GRAM NEGATIVE RODS ABUNDANT GRAM POSITIVE RODS    Culture   Final    ABUNDANT Consistent with normal respiratory flora. Performed at [facility], [address], [place], [place] [address]    Report Status 05/24/2018 FINAL  Final  MRSA PCR Screening     Status: None   Collection Time: 05/21/18 12:28 PM  Result Value Ref Range Status   MRSA by PCR NEGATIVE NEGATIVE Final    Comment:        The GeneXpert MRSA Assay (FDA approved for NASAL specimens only), is one component of a comprehensive MRSA colonization surveillance program. It is not intended to diagnose MRSA infection nor to guide or monitor treatment for MRSA infections. Performed at [facility], [address] [place], [place] [address]   Gastrointestinal Panel by PCR , Stool     Status: None   Collection Time: 05/25/18  1:24 PM  Result Value Ref Range Status   Campylobacter species NOT DETECTED NOT DETECTED Final   Plesimonas shigelloides NOT DETECTED NOT DETECTED Final   Salmonella  species NOT DETECTED NOT DETECTED Final   Yersinia enterocolitica NOT DETECTED NOT DETECTED Final   Vibrio species NOT DETECTED NOT DETECTED Final   Vibrio cholerae NOT DETECTED NOT DETECTED Final   Enteroaggregative E coli (EAEC) NOT DETECTED NOT DETECTED Final   Enteropathogenic E coli (EPEC) NOT DETECTED NOT DETECTED Final  Enterotoxigenic E coli (ETEC) NOT DETECTED NOT DETECTED Final   Shiga like toxin producing E coli (STEC) NOT DETECTED NOT DETECTED Final   Shigella/Enteroinvasive E coli (EIEC) NOT DETECTED NOT DETECTED Final   Cryptosporidium NOT DETECTED NOT DETECTED Final   Cyclospora cayetanensis NOT DETECTED NOT DETECTED Final   Entamoeba histolytica NOT DETECTED NOT DETECTED Final   Giardia lamblia NOT DETECTED NOT DETECTED Final   Adenovirus F40/41 NOT DETECTED NOT DETECTED Final   Astrovirus NOT DETECTED NOT DETECTED Final   Norovirus GI/GII NOT DETECTED NOT DETECTED Final   Rotavirus A NOT DETECTED NOT DETECTED Final   Sapovirus (I, II, IV, and V) NOT DETECTED NOT DETECTED Final    Comment: Performed at [facility], [address] [place], [place] [address]  C difficile quick scan w PCR reflex     Status: None   Collection Time: 05/25/18  1:24 PM  Result Value Ref Range Status   C Diff antigen NEGATIVE NEGATIVE Final   C Diff toxin NEGATIVE NEGATIVE Final   C Diff interpretation No C. difficile detected.  Final    Comment: Performed at [facility], [address] [place], [place] [address]  CULTURE, BLOOD (ROUTINE X 2) w Reflex to ID Panel     Status: None (Preliminary result)   Collection Time: 05/25/18  2:59 PM  Result Value Ref Range Status   Specimen Description BLOOD LAC  Final   Special Requests   Final    BOTTLES DRAWN AEROBIC AND ANAEROBIC Blood Culture adequate volume   Culture   Final    NO GROWTH 3 DAYS Performed at [facility], [address] [place], [place] [address]    Report Status PENDING  Incomplete  CULTURE,  BLOOD (ROUTINE X 2) w Reflex to ID Panel     Status: None (Preliminary result)   Collection Time: 05/25/18  3:09 PM  Result Value Ref Range Status   Specimen Description BLOOD R HAND  Final   Special Requests   Final    BOTTLES DRAWN AEROBIC AND ANAEROBIC Blood Culture adequate volume   Culture   Final    NO GROWTH 3 DAYS Performed at [facility], [address], [place], [place] [address]    Report Status PENDING  Incomplete  CSF culture     Status: None (Preliminary result)   Collection Time: 05/28/18  8:46 AM  Result Value Ref Range Status   Specimen Description CSF  Final   Special Requests NONE  Final   Gram Stain   Final    NO ORGANISMS SEEN WBC SEEN RBC SEEN Performed at [facility], [address] [place], [place] [address]    Culture PENDING  Incomplete   Report Status PENDING  Incomplete    Coagulation Studies: No results for input(s): LABPROT, INR in the last 72 hours.  Imaging: Dg Fluoro Guided Loc Of Needle/cath Tip For Spinal Inject Lt  Result Date: 05/28/2018 CLINICAL DATA:  Fever of unknown origin EXAM: DIAGNOSTIC LUMBAR PUNCTURE UNDER FLUOROSCOPIC GUIDANCE FLUOROSCOPY TIME:  Fluoroscopy Time:  1 minutes 18 seconds Radiation Exposure Index (if provided by the fluoroscopic device): 45 mGy Number of Acquired Spot Images: 1 PROCEDURE: Informed consent was obtained from the patient's wife prior to the procedure, including potential complications of headache, allergy, and pain. With the patient prone, the lower back was prepped with Betadine. 1% Lidocaine was used for local anesthesia. Lumbar puncture was performed at the L4 level using a 20 gauge needle  with return of clear CSF with an opening pressure of 17 cm water. Thirteen ml of CSF were obtained for laboratory studies. The patient tolerated the procedure well and there were no apparent complications. IMPRESSION: Successful fluoroscopic lumbar puncture as described. Electronically Signed   By:  Alcide CleverMark  Lukens M.D.   On: 05/28/2018 09:01    Medications:  I have reviewed the patient's current medications. Prior to Admission:  Medications Prior to Admission  Medication Sig Dispense Refill Last Dose  . donepezil (ARICEPT) 10 MG tablet TAKE 1 TABLET BY MOUTH DAILY IN THE MORNING (Patient taking differently: Take 10 mg by mouth daily. TAKE 1 TABLET BY MOUTH DAILY IN THE MORNING) 90 tablet 3 05/21/2018 at 0700  . escitalopram (LEXAPRO) 10 MG tablet Take 1 tablet (10 mg total) by mouth at bedtime.   05/20/2018 at 2100  . fexofenadine (ALLEGRA) 180 MG tablet Take 180 mg by mouth daily as needed for allergies.    prn at prn  . memantine (NAMENDA) 10 MG tablet Take 1 tablet (10 mg total) by mouth 2 (two) times daily. 180 tablet 3 05/21/2018 at 0700  . risperiDONE (RISPERDAL) 0.25 MG tablet Take 1 tablet (0.25 mg total) by mouth 2 (two) times daily.   05/21/2018 at 0700  . benzonatate (TESSALON) 200 MG capsule Take 1 capsule (200 mg total) by mouth 3 (three) times daily as needed. (Patient not taking: Reported on 05/21/2018) 30 capsule 1 Completed Course at Unknown time  . rivastigmine (EXELON) 4.6 mg/24hr Place 1 patch (4.6 mg total) onto the skin daily. (Patient not taking: Reported on 05/21/2018) 30 patch 12 Not Taking at Unknown time  . [DISCONTINUED] amoxicillin-clavulanate (AUGMENTIN) 875-125 MG tablet Take 1 tablet by mouth 2 (two) times daily. (Patient not taking: Reported on 05/21/2018) 16 tablet 0 Completed Course at Unknown time   Scheduled: . donepezil  10 mg Oral Daily  . enoxaparin (LOVENOX) injection  40 mg Subcutaneous Q24H  . erythromycin   Both Eyes QHS  . loratadine  10 mg Oral Daily  . memantine  10 mg Oral BID    Assessment: 72. 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  Found to be febrile with suspected aspiration  pneumonia.  On exam patient did have neck stiffness and increased rigidity therefore started on ceftriaxone, ampicillin, vancomycin, acyclovir for empiric meningitis/rule out HSV encephalitis.  LP obtained which is negative thus.  Patient remains altered in the setting of infectious process.  Mental status may also be due to progression of underlying neurodegenerative disorder. MRI brain shows no acute abnormality but shows general cerebral volume loss with likely FTD.  Recommendations 1. D/c empiric treatment for meningitis/HSV encephalitis as LP is negative 2.  Agree with current medical management  This patient was staffed with Dr. Loretha BrasilZeylikman, Doyle AskewYuriy who personally evaluated patient, reviewed documentation and agreed with assessment and plan of care as above.  Webb SilversmithElizabeth , DNP, FNP-BC Board certified Nurse Practitioner Neurology Department     LOS: 7 days   05/28/2018  5:43 PM

## 2018-05-28 NOTE — Progress Notes (Signed)
Notified by CSW Ruthe MannanCandace Garrison that current plan is for patient to discharge to his home in [place] with hospice services [facility]. Referral notified as a previous referral had been received on 11/26 for outpatient Palliative at his son's home in [place]. Thank you. Dayna BarkerKaren Robertson RN, BSN, [facility] [place], hospital Liaison [phone]

## 2018-05-28 NOTE — Care Management (Signed)
Will fax updated information to Jetty Duhameliffany Webb RN, [facility] representative. Fax = [phone] Gwenette GreetBrenda S Ameir Faria RN MSN CCM Care Management. [phone]

## 2018-05-28 NOTE — Progress Notes (Signed)
PT Cancellation Note  Patient Details Name: Bryan ShellRobert Hartsough MRN: [id number] DOB: Nov 30, 1945   Cancelled Treatment:    Reason Eval/Treat Not Completed: Fatigue/lethargy limiting ability to participate;Other (comment).  Pt is not feeling up to working with PT and will try again at another time.  States he has the "flu" but there are no precautions for him.  Follow up with pt as tolerated.   Ivar DrapeRuth E Shamarcus Hoheisel 05/28/2018, 12:59 PM   Samul Dadauth Lynnzie Blackson, PT MS Acute Rehab Dept. Number: [facility] [id number] and [facility] [phone]

## 2018-05-28 NOTE — Care Management Important Message (Signed)
Important Message  Patient Details  Name: Bryan ShellRobert Berry MRN: [id number] Date of Birth: [birth year]   Medicare Important Message Given:  Yes    Olegario MessierKathy A Ortha Metts 05/28/2018, 11:10 AM

## 2018-05-28 NOTE — Care Management (Signed)
Spoke with wife, Demma, at the bedside. States that she wants to take her husband home with Hospice. She will need hospital bed and any other help that she can get. Currently has a Agricultural engineernursing assistant that comes to the home 2 days a week for several hours a day while she does errands. This service in provided by Team Nurse Sheilah Mins(Emily Dix RN [phone]). Telephone call to Jetty Duhameliffany Webb, representative for [facility] [phone]). Left voice mail. Gwenette GreetBrenda S Lindwood Mogel RN MSN CCM Care Management [phone]

## 2018-05-29 NOTE — Progress Notes (Signed)
  Speech Language Pathology Treatment: Dysphagia  Patient Details Name: Bryan Berry MRN: [id number] DOB: 1945/08/04 Today's Date: 05/29/2018 Time: 0865-7846 SLP Time Calculation (min) (ACUTE ONLY): 45 min  Assessment / Plan / Recommendation Clinical Impression  Pt seen for ongoing assessment of toleration of diet - recommended Dysphagia level 1 (puree) w/ Nectar consistency liquids. Bryan Berry present. Pt has been drowsy, sleepy these past few days per family and NSG. Pt min more awake at this time w/ mumbled verbal responses and indicating he wants something to eat/drink.  Pt positioned upright in bed and given TSP trials of Nectar liquids (fed to him by Bryan Berry). Pt consumed ~6ozs w/ no overt s/s of aspiration noted; clear vocal quality noted by trials. Pt exhibited min OM confusion and discoordination but responded well to tactile/verbal stim of spoon to the lips. Bryan Berry was observant of his cues and guided pt well in following aspiration precautions as posted.  Discussed education on aspiration precautions; diet consistency(foods, liquids); food and drink options, food and drink preparation. Recommend continue this recommended diet at discharge d/t increased risk for aspiration secondary to the dysphagia and declined Cognitive status, Dementia. Handouts given on the diet recommendations, liquid consistency, ordering information of Nectar liquid products, aspiration precautions. Bryan Berry agreed. ST services will be available for further education if needed; NSG to reconsult.    HPI HPI: Pt is a 72 y.o. male with a known history of advanced Dementia who presents from home due to altered mental status and cough.  Patient has Dementia.  HPI is taken from ER physician and nurse at bedside, Bryan Berry.  Apparently over the past day, patient has had altered mental status and is more confused than normal.  Normally he is awake and conversant and follows some commands, however today, he is  unable to do so.  He was very weak and so was brought to the ER for further evaluation.  In the emergency room he has a congested cough.  Chest x-ray does not show pneumonia however there is concern for pneumonia.  There is no fever or chills documented.  He was started on Zosyn and vancomycin.  Today, pt is more awake though keeps eyes closed.  He responds w/ few mumbled words w/ low volume but some responses are intelligible. He does respond appropriately w/ opening mouth when given tactile/verbal cues. Bryan Berry present.        SLP Plan  All goals met       Recommendations  Diet recommendations: Dysphagia 1 (puree);Nectar-thick liquid Liquids provided via: Teaspoon;Cup Medication Administration: Crushed with puree Supervision: Staff to assist with self feeding;Full supervision/cueing for compensatory strategies Compensations: Minimize environmental distractions;Slow rate;Small sips/bites;Lingual sweep for clearance of pocketing;Multiple dry swallows after each bite/sip;Follow solids with liquid Postural Changes and/or Swallow Maneuvers: Seated upright 90 degrees;Upright 30-60 min after meal                General recommendations: (Dietician f/u) Oral Care Recommendations: Oral care BID;Staff/trained caregiver to provide oral care Follow up Recommendations: None(unless determined post discharge per PCP) SLP Visit Diagnosis: Dysphagia, oropharyngeal phase (R13.12) Plan: All goals met       GO                Bryan Kenner, MS, CCC-SLP , 05/29/2018, 12:26 PM

## 2018-05-29 NOTE — Care Management (Addendum)
[facility] fron [facility]. Case has been approved. Will be coordinating delivery of equipment per hospice agency. Will coordinate rescue ride x 24 hours after plans are in place. Will update Ms. Bryan IvoryAustin Received telephone call from Bryan RuizJohn at EMS office. States to call back in the morning with a specific pick-up time. Discussed possibly 9:30am. Case Manager will call EMS in the morning Bryan GreetBrenda S Ameet Sandy RN MSN CCM Care Management [phone]

## 2018-05-29 NOTE — Progress Notes (Signed)
[employer] - [place] at [facility]      PATIENT NAME: Bryan Berry    MR#:  [id number]  DATE OF BIRTH:  May 12, 1946  SUBJECTIVE:   No acute events overnight.  Patient remains lethargic/encephalopathic.  Plan for discharge to home with hospice tomorrow.  REVIEW OF SYSTEMS:    Review of Systems  Unable to perform ROS: Dementia    Nutrition: Dysphagia 1 Tolerating Diet: No very little  DRUG ALLERGIES:  No Known Allergies  VITALS:  Blood pressure 136/86, pulse 91, temperature 98.9 F (37.2 C), temperature source Oral, resp. rate 18, height 5\' 9"  (1.753 m), weight 80.2 kg, SpO2 94 %.  PHYSICAL EXAMINATION:   Physical Exam  GENERAL:  72 y.o.-year-old patient lying in bed lethargic/Encephalopathic.   EYES: Pupils equal, round, reactive to light. No scleral icterus.   HEENT: Head atraumatic, normocephalic. Oropharynx and nasopharynx clear. Dry Oral Mucosa.  NECK:  Supple, no jugular venous distention. No thyroid enlargement, no tenderness.  LUNGS: Normal breath sounds bilaterally, no wheezing, rales, rhonchi. No use of accessory muscles of respiration.  CARDIOVASCULAR: S1, S2 normal. No murmurs, rubs, or gallops.  ABDOMEN: Soft, nontender, nondistended. Bowel sounds present. No organomegaly or mass.  EXTREMITIES: No cyanosis, clubbing or edema b/l.    NEUROLOGIC: Difficult to assess due to pt's dementia.  Encephalopathic.  PSYCHIATRIC: The patient is alert and oriented x 1. Encephalopathic.  SKIN: No obvious rash, lesion, or ulcer.    LABORATORY PANEL:   CBC Recent Labs  Lab 05/28/18 0543  WBC 15.0*  HGB 11.3*  HCT 34.4*  PLT 226   ------------------------------------------------------------------------------------------------------------------  Chemistries  Recent Labs  Lab 05/24/18 0358  05/28/18 1319  NA 141  --   --   K 3.6  --   --   CL 105  --   --   CO2 26  --   --   GLUCOSE 124*  --   --   BUN 12  --   --   CREATININE 0.62   < >  0.57*  CALCIUM 8.5*  --   --    < > = values in this interval not displayed.   ------------------------------------------------------------------------------------------------------------------  Cardiac Enzymes No results for input(s): TROPONINI in the last 168 hours. ------------------------------------------------------------------------------------------------------------------  RADIOLOGY:  Dg Fluoro Guided Loc Of Needle/cath Tip For Spinal Inject Lt  Result Date: 05/28/2018 CLINICAL DATA:  Fever of unknown origin EXAM: DIAGNOSTIC LUMBAR PUNCTURE UNDER FLUOROSCOPIC GUIDANCE FLUOROSCOPY TIME:  Fluoroscopy Time:  1 minutes 18 seconds Radiation Exposure Index (if provided by the fluoroscopic device): 45 mGy Number of Acquired Spot Images: 1 PROCEDURE: Informed consent was obtained from the patient's wife prior to the procedure, including potential complications of headache, allergy, and pain. With the patient prone, the lower back was prepped with Betadine. 1% Lidocaine was used for local anesthesia. Lumbar puncture was performed at the L4 level using a 20 gauge needle with return of clear CSF with an opening pressure of 17 cm water. Thirteen ml of CSF were obtained for laboratory studies. The patient tolerated the procedure well and there were no apparent complications. IMPRESSION: Successful fluoroscopic lumbar puncture as described. Electronically Signed   By: Alcide Clever M.D.   On: 05/28/2018 09:01     ASSESSMENT AND PLAN:   72 year old male with history of dementia who presents due to altered mental status.  *Acute metabolic encephalopathy -Initially suspected to be secondary to worsening dementia combined with underlying aspiration pneumonia.  CT head, MRI of the  brain were negative for acute pathology.  He was apparently ambulatory and more functional about 2 weeks ago. - Given patient's fever and encephalopathy there was concern for underlying meningitis/encephalitis and therefore  empirically patient was started on IV antibiotics and antivirals.  Despite being on those medications patient's mental status did not improved.  Underwent lumbar puncture 05/28/18 and the fluid analysis was negative for encephalitis/meningitis.  Antibiotics have been discontinued. - Mental status has not improved therefore this is likely secondary to worsening dementia and encephalopathy unlikely from other infectious pathology.  Discussed with neurology and they are in agreement.   * Fever of unknown origin - fever has resolved.  -Patient's blood cultures remain negative. UA (-), CXR was suggestive of aspiration a few days back.  I suspect this is secondary to microaspiration/aspiration pneumonia and unlikely related to meningitis/encephalitis.  Patient's lumbar puncture with spinal fluid analysis is negative for encephalitis or meningitis.  *Chronic dementia - cont. Namenda, Aricept, Risperdal.  -Patient's mental status has not improved and he continues to be lethargic/encephalopathic with poor p.o. intake.  He has not improved despite being on broad-spectrum empiric antibiotics/antivirals.  This is secondary to patient's worsening dementia.  Prognosis is poor to guarded given patient's no cooperation with physical therapy and poor p.o. intake.   Discussed plan of care with patient's wife at bedside as patient has not improved despite aggressive treatment with broad-spectrum IV antibiotics and supportive care. Patient likely has progressive dementia with worsening decline.  He has poor p.o. intake/failure to thrive and would benefit from hospice services. Wife in Agreement and discharge Home with Hospice tomorrow.    Greater than 50% of time spent in providing emotional support to the patient's wife and discussing patient's prognosis and answering questions.   All the records are reviewed and case discussed with Care Management/Social Worker. Management plans discussed with the patient, family  and they are in agreement.  CODE STATUS: Full code  DVT Prophylaxis: Lovenox  TOTAL TIME TAKING CARE OF THIS PATIENT: 30 minutes.   POSSIBLE D/C IN 1-2 DAYS, DEPENDING ON CLINICAL CONDITION.   Houston SirenSAINANI,VIVEK J M.D on 05/29/2018 at 2:23 PM  Between 7am to 6pm - Pager - [phone]  After 6pm go to [email or web address] - Social research officer, governmentpassword EPAS [email or web address]  [facility] [place] [facility]  Office  [phone]  CC: Primary care physician; Joaquim Namuncan, Graham S, MD

## 2018-05-29 NOTE — Progress Notes (Signed)
PT Cancellation Note  Patient Details Name: Bryan ShellRobert Sky MRN: [id number] DOB: March 30, 1946   Cancelled Treatment:    Reason Eval/Treat Not Completed: Medical issues which prohibited therapy;Patient's level of consciousness. Chart reviewed. Pt noted to be making very minimal progress both medically and cognitively. Family has decided for patient to return to home with hospice services. Pt will be DC from PT services at this time until either better able to participate in a meaningful way or plan of care changes. Please place new PT order if and when warranted.   8:54 AM, 05/29/18 Rosamaria LintsAllan C Jhovanny Guinta, PT, DPT Physical Therapist - [facility]  [phone] (ASCOM)       Stepfanie Yott C 05/29/2018, 8:52 AM

## 2018-05-30 ENCOUNTER — Telehealth: Payer: Self-pay

## 2018-05-30 LAB — CULTURE, BLOOD (ROUTINE X 2)
CULTURE: NO GROWTH
Culture: NO GROWTH
SPECIAL REQUESTS: ADEQUATE
Special Requests: ADEQUATE

## 2018-05-30 MED ORDER — MORPHINE SULFATE 20 MG/5ML PO SOLN: 5.0000 mg | ORAL | 0 refills | Status: AC | PRN

## 2018-05-30 NOTE — Telephone Encounter (Signed)
Left message for patient to call back to complete TCM call and f/u appt. Patient did go home with hospice care. Will see how patient is doing.

## 2018-05-30 NOTE — Discharge Summary (Addendum)
[employer] - [place] at [facility]   PATIENT NAME: Bryan Berry    MR#:  [id number]  DATE OF BIRTH:  1945-09-05  DATE OF ADMISSION:  05/21/2018   ADMITTING PHYSICIAN: Adrian Saran, MD  DATE OF DISCHARGE: 05/30/2018 PRIMARY CARE PHYSICIAN: Joaquim Nam, MD   ADMISSION DIAGNOSIS:  Edema [R60.9] Cough [R05] Altered mental status, unspecified altered mental status type [R41.82] DISCHARGE DIAGNOSIS:  Active Problems:   PNA (pneumonia)  SECONDARY DIAGNOSIS:   Past Medical History:  Diagnosis Date  . Allergy   . Dementia (HCC)   . Memory loss   . UTI (lower urinary tract infection)    one episode   HOSPITAL COURSE:  72 year old male with history of dementia who presents due to altered mental status.  *Acute metabolic encephalopathy -Initially suspected to be secondary to worsening dementia combined with underlying aspiration pneumonia.  CT head, MRI of the brain were negative for acute pathology.  He was apparently ambulatory and more functional about 2 weeks ago. - Given patient's fever and encephalopathy there was concern for underlying meningitis/encephalitis and therefore empirically patient was started on IV antibiotics and antivirals.  Despite being on those medications patient's mental status did not improved.  Underwent lumbar puncture 05/28/18 and the fluid analysis was negative for encephalitis/meningitis.  Antibiotics have been discontinued. - Mental status has not improved therefore this is likely secondary to worsening dementia and encephalopathy unlikely from other infectious pathology.  Discussed with neurology and they are in agreement.   * Fever of unknown origin - fever has resolved.  -Patient's blood cultures remain negative. UA (-), CXR was suggestive of aspiration a few days back.  I suspect this is secondary to microaspiration/aspiration pneumonia and unlikely related to meningitis/encephalitis.  Patient's lumbar puncture with spinal  fluid analysis is negative for encephalitis or meningitis.  *Chronic dementia - cont. Namenda, Aricept, Risperdal.  -Patient's mental status has not improved and he continues to be lethargic/encephalopathic with poor p.o. intake.  He has not improved despite being on broad-spectrum empiric antibiotics/antivirals.  This is secondary to patient's worsening dementia.  Prognosis is poor to guarded given patient's no cooperation with physical therapy and poor p.o. intake.  Dr. Cherlynn Kaiser discussed plan of care with patient's wife as patient has not improved despite aggressive treatment with broad-spectrum IV antibiotics and supportive care. Patient likely has progressive dementia with worsening decline.  He has poor p.o. intake/failure to thrive and would benefit from hospice services. Wife in Agreement and discharge Home with Hospice today. DISCHARGE CONDITIONS:  Poor prognosis, discharge to home with hospice. CONSULTS OBTAINED:  Treatment Team:  Pauletta Browns, MD DRUG ALLERGIES:  No Known Allergies DISCHARGE MEDICATIONS:   Allergies as of 05/30/2018   No Known Allergies     Medication List    STOP taking these medications   amoxicillin-clavulanate 875-125 MG tablet Commonly known as:  AUGMENTIN   benzonatate 200 MG capsule Commonly known as:  TESSALON   rivastigmine 4.6 mg/24hr Commonly known as:  EXELON     TAKE these medications   donepezil 10 MG tablet Commonly known as:  ARICEPT TAKE 1 TABLET BY MOUTH DAILY IN THE MORNING What changed:    how much to take  how to take this  when to take this   escitalopram 10 MG tablet Commonly known as:  LEXAPRO Take 1 tablet (10 mg total) by mouth at bedtime.   fexofenadine 180 MG tablet Commonly known as:  ALLEGRA Take 180 mg by mouth daily as  needed for allergies.   memantine 10 MG tablet Commonly known as:  NAMENDA Take 1 tablet (10 mg total) by mouth 2 (two) times daily.   morphine 20 MG/5ML solution Take 1.3 mLs (5.2  mg total) by mouth every 2 (two) hours as needed for pain.   risperiDONE 0.25 MG tablet Commonly known as:  RISPERDAL Take 1 tablet (0.25 mg total) by mouth 2 (two) times daily.        DISCHARGE INSTRUCTIONS:  See AVS. If you experience worsening of your admission symptoms, develop shortness of breath, life threatening emergency, suicidal or homicidal thoughts you must seek medical attention immediately by calling 911 or calling your MD immediately  if symptoms less severe.  You Must read complete instructions/literature along with all the possible adverse reactions/side effects for all the Medicines you take and that have been prescribed to you. Take any new Medicines after you have completely understood and accpet all the possible adverse reactions/side effects.   Please note  You were cared for by a hospitalist during your hospital stay. If you have any questions about your discharge medications or the care you received while you were in the hospital after you are discharged, you can call the unit and asked to speak with the hospitalist on call if the hospitalist that took care of you is not available. Once you are discharged, your primary care physician will handle any further medical issues. Please note that NO REFILLS for any discharge medications will be authorized once you are discharged, as it is imperative that you return to your primary care physician (or establish a relationship with a primary care physician if you do not have one) for your aftercare needs so that they can reassess your need for medications and monitor your lab values.    On the day of Discharge:  VITAL SIGNS:  Blood pressure (!) 144/95, pulse 85, temperature 98.2 F (36.8 C), temperature source Axillary, resp. rate 16, height 5\' 9"  (1.753 m), weight 80.2 kg, SpO2 96 %. PHYSICAL EXAMINATION:  GENERAL:  72 y.o.-year-old patient lying in the bed with no acute distress.  EYES: Pupils equal, round, reactive to  light and accommodation. No scleral icterus. Extraocular muscles intact.  HEENT: Head atraumatic, normocephalic.  NECK:  Supple, no jugular venous distention. No thyroid enlargement, no tenderness.  LUNGS: Normal breath sounds bilaterally, no wheezing, rales,rhonchi or crepitation. No use of accessory muscles of respiration.  CARDIOVASCULAR: S1, S2 normal. No murmurs, rubs, or gallops.  ABDOMEN: Soft, non-tender, non-distended. Bowel sounds present. No organomegaly or mass.  EXTREMITIES: No pedal edema, cyanosis, or clubbing.  NEUROLOGIC: unable to exam. PSYCHIATRIC: The patient is demented. SKIN: No obvious rash, lesion, or ulcer.  DATA REVIEW:   CBC Recent Labs  Lab 05/28/18 0543  WBC 15.0*  HGB 11.3*  HCT 34.4*  PLT 226    Chemistries  Recent Labs  Lab 05/24/18 0358  05/28/18 1319  NA 141  --   --   K 3.6  --   --   CL 105  --   --   CO2 26  --   --   GLUCOSE 124*  --   --   BUN 12  --   --   CREATININE 0.62   < > 0.57*  CALCIUM 8.5*  --   --    < > = values in this interval not displayed.     Microbiology Results  Results for orders placed or performed during the hospital encounter of  05/21/18  Blood culture (routine x 2)     Status: None   Collection Time: 05/21/18  9:31 AM  Result Value Ref Range Status   Specimen Description BLOOD LEFT ARM  Final   Special Requests   Final    BOTTLES DRAWN AEROBIC AND ANAEROBIC Blood Culture adequate volume   Culture   Final    NO GROWTH 5 DAYS Performed at [facility], [address] [place], [place] [address]    Report Status 05/26/2018 FINAL  Final  Blood culture (routine x 2)     Status: None   Collection Time: 05/21/18  9:31 AM  Result Value Ref Range Status   Specimen Description BLOOD LEFT ARM  Final   Special Requests   Final    BOTTLES DRAWN AEROBIC AND ANAEROBIC Blood Culture adequate volume   Culture   Final    NO GROWTH 5 DAYS Performed at [facility], [address]  [place], [place] [address]    Report Status 05/26/2018 FINAL  Final  Expectorated sputum assessment w rflx to resp cult     Status: None   Collection Time: 05/21/18 11:58 AM  Result Value Ref Range Status   Specimen Description SPUTUM  Final   Special Requests Normal  Final   Sputum evaluation   Final    THIS SPECIMEN IS ACCEPTABLE FOR SPUTUM CULTURE Performed at [facility], [address] [place], [place] [address]    Report Status 05/21/2018 FINAL  Final  Culture, respiratory     Status: None   Collection Time: 05/21/18 11:58 AM  Result Value Ref Range Status   Specimen Description   Final    SPUTUM Performed at [facility], [address] [place], [place] [address]    Special Requests   Final    Normal Reflexed from [id number] Performed at [facility], [address] [place], [place] [address]    Gram Stain   Final    MODERATE WBC PRESENT, PREDOMINANTLY PMN FEW SQUAMOUS EPITHELIAL CELLS PRESENT ABUNDANT GRAM POSITIVE COCCI ABUNDANT GRAM NEGATIVE RODS ABUNDANT GRAM POSITIVE RODS    Culture   Final    ABUNDANT Consistent with normal respiratory flora. Performed at [facility], [address] [place], [place] [address]    Report Status 05/24/2018 FINAL  Final  MRSA PCR Screening     Status: None   Collection Time: 05/21/18 12:28 PM  Result Value Ref Range Status   MRSA by PCR NEGATIVE NEGATIVE Final    Comment:        The GeneXpert MRSA Assay (FDA approved for NASAL specimens only), is one component of a comprehensive MRSA colonization surveillance program. It is not intended to diagnose MRSA infection nor to guide or monitor treatment for MRSA infections. Performed at [facility], [address] [place], [place] [address]   Gastrointestinal Panel by PCR , Stool     Status: None   Collection Time: 05/25/18  1:24 PM  Result Value Ref Range Status   Campylobacter species NOT DETECTED NOT DETECTED Final   Plesimonas  shigelloides NOT DETECTED NOT DETECTED Final   Salmonella species NOT DETECTED NOT DETECTED Final   Yersinia enterocolitica NOT DETECTED NOT DETECTED Final   Vibrio species NOT DETECTED NOT DETECTED Final   Vibrio cholerae NOT DETECTED NOT DETECTED Final   Enteroaggregative E coli (EAEC) NOT DETECTED NOT DETECTED Final   Enteropathogenic E coli (EPEC) NOT DETECTED NOT DETECTED Final   Enterotoxigenic  E coli (ETEC) NOT DETECTED NOT DETECTED Final   Shiga like toxin producing E coli (STEC) NOT DETECTED NOT DETECTED Final   Shigella/Enteroinvasive E coli (EIEC) NOT DETECTED NOT DETECTED Final   Cryptosporidium NOT DETECTED NOT DETECTED Final   Cyclospora cayetanensis NOT DETECTED NOT DETECTED Final   Entamoeba histolytica NOT DETECTED NOT DETECTED Final   Giardia lamblia NOT DETECTED NOT DETECTED Final   Adenovirus F40/41 NOT DETECTED NOT DETECTED Final   Astrovirus NOT DETECTED NOT DETECTED Final   Norovirus GI/GII NOT DETECTED NOT DETECTED Final   Rotavirus A NOT DETECTED NOT DETECTED Final   Sapovirus (I, II, IV, and V) NOT DETECTED NOT DETECTED Final    Comment: Performed at [facility], [address] [place], [place] [address]  C difficile quick scan w PCR reflex     Status: None   Collection Time: 05/25/18  1:24 PM  Result Value Ref Range Status   C Diff antigen NEGATIVE NEGATIVE Final   C Diff toxin NEGATIVE NEGATIVE Final   C Diff interpretation No C. difficile detected.  Final    Comment: Performed at [facility], [address] [place], [place] [address]  CULTURE, BLOOD (ROUTINE X 2) w Reflex to ID Panel     Status: None (Preliminary result)   Collection Time: 05/25/18  2:59 PM  Result Value Ref Range Status   Specimen Description BLOOD LAC  Final   Special Requests   Final    BOTTLES DRAWN AEROBIC AND ANAEROBIC Blood Culture adequate volume   Culture   Final    NO GROWTH 4 DAYS Performed at [facility], [address] [place],  [place] [address]    Report Status PENDING  Incomplete  CULTURE, BLOOD (ROUTINE X 2) w Reflex to ID Panel     Status: None (Preliminary result)   Collection Time: 05/25/18  3:09 PM  Result Value Ref Range Status   Specimen Description BLOOD R HAND  Final   Special Requests   Final    BOTTLES DRAWN AEROBIC AND ANAEROBIC Blood Culture adequate volume   Culture   Final    NO GROWTH 4 DAYS Performed at [facility], [address], [place], [place] [address]    Report Status PENDING  Incomplete  CSF culture     Status: None (Preliminary result)   Collection Time: 05/28/18  8:46 AM  Result Value Ref Range Status   Specimen Description   Final    CSF Performed at [facility], [address], [place], [place] [address]    Special Requests   Final    NONE Performed at [facility], [address] [place], [place] [address]    Gram Stain   Final    NO ORGANISMS SEEN WBC SEEN RBC SEEN Performed at [facility], [address], [place], [place] [address]    Culture   Final    NO GROWTH < 24 HOURS Performed at [facility], [address], [place], [place] [address]    Report Status PENDING  Incomplete  Culture, fungus without smear     Status: None (Preliminary result)   Collection Time: 05/28/18  8:46 AM  Result Value Ref Range Status   Specimen Description   Final    CSF Performed at [facility], [address] [place], [place] [address]    Special Requests   Final    NONE Performed at [facility], [address]  [place], [place] [address]    Culture   Final    NO FUNGUS ISOLATED AFTER 1 DAY Performed at [facility], [address] [place], [place] [address]    Report Status PENDING  Incomplete    RADIOLOGY:  No results found.   Management plans discussed with the patient, family and they are in agreement.  CODE STATUS: DNR   TOTAL TIME TAKING CARE OF THIS PATIENT: 33 minutes.    Shaune Pollack  M.D on 05/30/2018 at 7:41 AM  Between 7am to 6pm - Pager - [phone]  After 6pm go to [email or web address] - password [email or web address] [facility]  [facility] [place] [facility]  Office  [phone]  CC: Primary care physician; Joaquim Nam, MD   Note: This dictation was prepared with Dragon dictation along with smaller phrase technology. Any transcriptional errors that result from this process are unintentional.

## 2018-05-30 NOTE — Care Management (Signed)
Telephone call to rescue to schedule pick-up for 9:30am. Discharge summary and copy of DNR faxed to [facility]. Wife updated. Gwenette GreetBrenda S Jayshon Dommer RN MSN CCM Care Management [phone]

## 2018-05-30 NOTE — Discharge Instructions (Signed)
Hospice care at home. °

## 2018-05-31 ENCOUNTER — Telehealth: Payer: Self-pay | Admitting: Family Medicine

## 2018-05-31 LAB — CSF CULTURE W GRAM STAIN
Culture: NO GROWTH
Gram Stain: NONE SEEN

## 2018-05-31 LAB — CSF CULTURE

## 2018-05-31 NOTE — Telephone Encounter (Signed)
[facility] called office wanting to confirm if Dr.Duncan received orders that she faxed over this morning for him to sign. The pt was [facility] by [facility] for advanced alzheimer and dementia.

## 2018-05-31 NOTE — Telephone Encounter (Signed)
Did you receive the Hospice orders for this patient yesterday through fax?

## 2018-05-31 NOTE — Telephone Encounter (Signed)
Noted. Thanks.

## 2018-05-31 NOTE — Telephone Encounter (Signed)
Dr. Para Marchuncan please note that patient cannot come to office due to end stage illness and prognosis.  Hospice is involved and patient has 24 hour care in place in the home.  I did speak with wife and offered our support in any way and let her know that they will be in our thoughts and prayers.  She thanks us for our support.

## 2018-05-31 NOTE — Telephone Encounter (Signed)
I don't have that fax and I have no recollection of seeing it.  Please give a verbal order. I'll attend, I want their help, please have them refax.  Thanks.

## 2018-05-31 NOTE — Telephone Encounter (Signed)
PPW faxed and placed in Dr. Lianne Bushyuncan's In Box.

## 2018-05-31 NOTE — Telephone Encounter (Signed)
Transition Care Management Follow-up Telephone Call   Date discharged?05/30/18   How have you been since you were released from the hospital? Not well, but hospice and care supports in place to help wife take care of patient.    Do you understand why you were in the hospital? Yes   Do you understand the discharge instructions? Yes   Where were you discharged to? Home with Hospice   Items Reviewed:  Medications reviewed: Yes   Allergies reviewed: Yes  Dietary changes reviewed: No changes in diet  Referrals reviewed: No referrals Hospice in place   Functional Questionnaire:   Activities of Daily Living (ADLs):   He states they are independent in the following: requires assist with all functions of daily living      States they require assistance with the following: ALL ADLS dressing, grooming, bathing, feeding, transfers as he is not ambulatory but is currently bedridden.    Any transportation issues/concerns? Yes, patient is bedridden and on hospice, he does not transport and will not be able to come to the office for hospital follow up.    Any patient concerns? No needs are being met by hospice and a 24 hour care team in the home.    Confirmed importance and date/time of follow-up visits scheduled : Patient cannot come to office due to end stage illness.   Provider Appointment booked with N/A Confirmed with patient if condition begins to worsen call PCP or go to the ER.  Patient was given the office number and encouraged to call back with question or concerns.  : Yes, wife knows to contact hospice with any emergent needs but also can call us if has any additional questions or support needs.

## 2018-06-01 NOTE — Telephone Encounter (Signed)
Forms faxed to appropriate fax numbers.

## 2018-06-01 NOTE — Telephone Encounter (Signed)
Forms done.  Thanks.   

## 2018-06-19 LAB — CULTURE, FUNGUS WITHOUT SMEAR

## 2018-07-23 ENCOUNTER — Ambulatory Visit: Payer: Medicare Other | Admitting: Adult Health

## 2018-07-26 ENCOUNTER — Telehealth: Payer: Self-pay | Admitting: *Deleted

## 2018-07-26 NOTE — Telephone Encounter (Signed)
Spoke to [place] with [facility] who states pt has passed away.

## 2018-07-27 NOTE — Telephone Encounter (Signed)
Late entry.  I called and talked to his wife and his son.  Both thanked me for the call.  I thanked them for their effort caring for this gentleman.  I was always glad to see him in the clinic.  He was a kind man and he will be missed.  I appreciate the help of all involved, including hospice.

## 2018-07-28 DEATH — deceased

## 2019-07-07 IMAGING — RF DG FLUORO GUIDE LUMBAR PUNCTURE
1 series · 2 of 2 positions shown · non-contrast
Comparison: none

CLINICAL DATA: Fever of unknown origin

[Series 1: fluoro_iodine 2fps_bw · 0.17mm/px · 2 of 2 frames shown]
[frame 1/2]
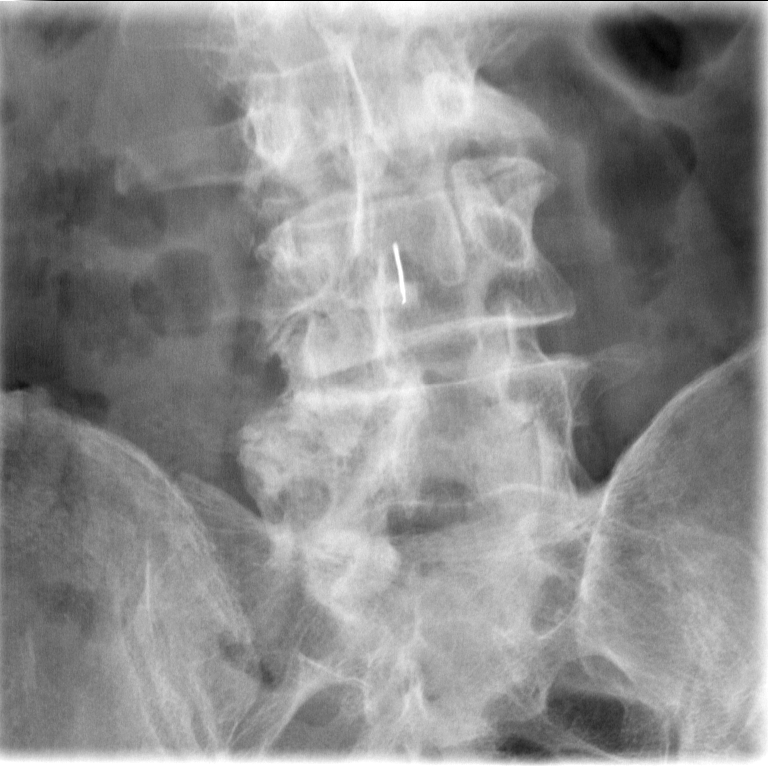
[frame 2/2]
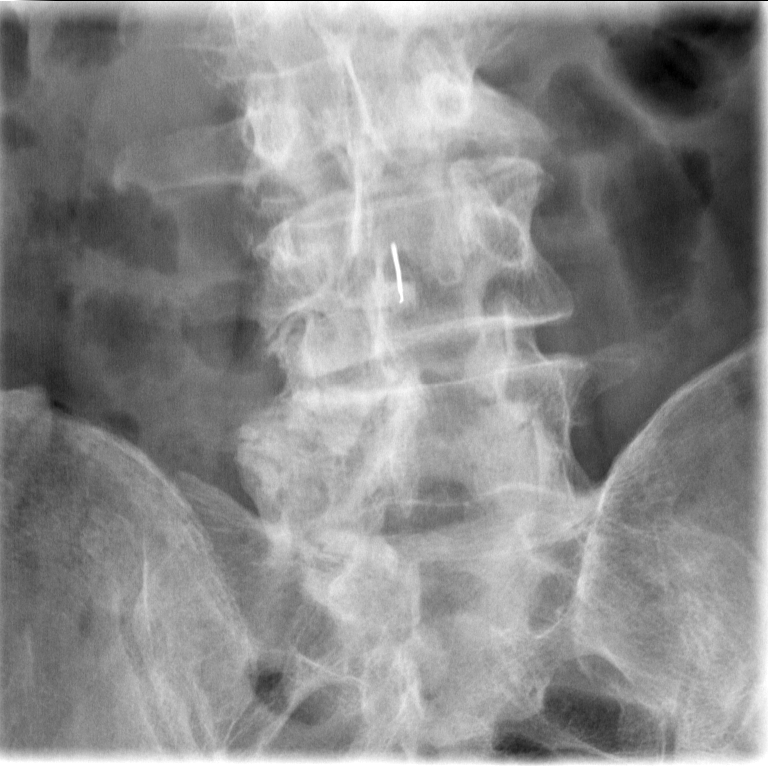

[2 of 2 positions shown; findings below may reference images not displayed]

EXAM:
DIAGNOSTIC LUMBAR PUNCTURE UNDER FLUOROSCOPIC GUIDANCE

FLUOROSCOPY TIME:  Fluoroscopy Time:  1 minutes 18 seconds

Radiation Exposure Index (if provided by the fluoroscopic device):
45 mGy

Number of Acquired Spot Images: 1

PROCEDURE:
Informed consent was obtained from the patient's wife prior to the
procedure, including potential complications of headache, allergy,
and pain. With the patient prone, the lower back was prepped with
Betadine. 1% Lidocaine was used for local anesthesia. Lumbar
puncture was performed at the L4 level using a 20 gauge needle with
return of clear CSF with an opening pressure of 17 cm water.
Thirteen ml of CSF were obtained for laboratory studies. The patient
tolerated the procedure well and there were no apparent
complications.
IMPRESSION: Successful fluoroscopic lumbar puncture as described.
